# Patient Record
Sex: Female | Born: 1980 | Race: White | Hispanic: No | Marital: Married | Smoking: Never smoker
Health system: Southern US, Community
[De-identification: ages and names within clinical notes are randomized; demographics above are authoritative.]

## PROBLEM LIST (undated history)

## (undated) DIAGNOSIS — F329 Major depressive disorder, single episode, unspecified: Secondary | ICD-10-CM

## (undated) DIAGNOSIS — Z9889 Other specified postprocedural states: Secondary | ICD-10-CM

## (undated) DIAGNOSIS — N390 Urinary tract infection, site not specified: Secondary | ICD-10-CM

## (undated) DIAGNOSIS — N200 Calculus of kidney: Secondary | ICD-10-CM

## (undated) DIAGNOSIS — Z87442 Personal history of urinary calculi: Secondary | ICD-10-CM

## (undated) DIAGNOSIS — I10 Essential (primary) hypertension: Secondary | ICD-10-CM

## (undated) DIAGNOSIS — R112 Nausea with vomiting, unspecified: Secondary | ICD-10-CM

## (undated) DIAGNOSIS — F909 Attention-deficit hyperactivity disorder, unspecified type: Secondary | ICD-10-CM

## (undated) DIAGNOSIS — U071 COVID-19: Secondary | ICD-10-CM

## (undated) DIAGNOSIS — F419 Anxiety disorder, unspecified: Secondary | ICD-10-CM

## (undated) DIAGNOSIS — F32A Depression, unspecified: Secondary | ICD-10-CM

## (undated) DIAGNOSIS — C8190 Hodgkin lymphoma, unspecified, unspecified site: Secondary | ICD-10-CM

## (undated) DIAGNOSIS — E079 Disorder of thyroid, unspecified: Secondary | ICD-10-CM

## (undated) DIAGNOSIS — C801 Malignant (primary) neoplasm, unspecified: Secondary | ICD-10-CM

## (undated) DIAGNOSIS — J45909 Unspecified asthma, uncomplicated: Secondary | ICD-10-CM

## (undated) DIAGNOSIS — Z973 Presence of spectacles and contact lenses: Secondary | ICD-10-CM

## (undated) HISTORY — DX: Essential (primary) hypertension: I10

## (undated) HISTORY — PX: KIDNEY SURGERY: SHX687

## (undated) HISTORY — DX: Urinary tract infection, site not specified: N39.0

## (undated) HISTORY — DX: Depression, unspecified: F32.A

## (undated) HISTORY — DX: Major depressive disorder, single episode, unspecified: F32.9

## (undated) HISTORY — DX: Calculus of kidney: N20.0

## (undated) HISTORY — PX: TONSILLECTOMY: SUR1361

## (undated) HISTORY — DX: Hodgkin lymphoma, unspecified, unspecified site: C81.90

## (undated) HISTORY — PX: PORT-A-CATH REMOVAL: SHX5289

## (undated) HISTORY — DX: COVID-19: U07.1

## (undated) HISTORY — DX: Anxiety disorder, unspecified: F41.9

---

## 2003-11-28 HISTORY — PX: OTHER SURGICAL HISTORY: SHX169

## 2016-12-04 ENCOUNTER — Encounter: Payer: Self-pay | Admitting: *Deleted

## 2016-12-04 ENCOUNTER — Emergency Department: Payer: BLUE CROSS/BLUE SHIELD

## 2016-12-04 ENCOUNTER — Emergency Department
Admission: EM | Admit: 2016-12-04 | Discharge: 2016-12-04 | Disposition: A | Discharge status: Home / Self Care | Payer: BLUE CROSS/BLUE SHIELD | Attending: Emergency Medicine | Admitting: Emergency Medicine

## 2016-12-04 DIAGNOSIS — W228XXA Striking against or struck by other objects, initial encounter: Secondary | ICD-10-CM | POA: Insufficient documentation

## 2016-12-04 DIAGNOSIS — J111 Influenza due to unidentified influenza virus with other respiratory manifestations: Secondary | ICD-10-CM

## 2016-12-04 DIAGNOSIS — E860 Dehydration: Secondary | ICD-10-CM | POA: Diagnosis not present

## 2016-12-04 DIAGNOSIS — Y9301 Activity, walking, marching and hiking: Secondary | ICD-10-CM | POA: Insufficient documentation

## 2016-12-04 DIAGNOSIS — S0990XA Unspecified injury of head, initial encounter: Secondary | ICD-10-CM

## 2016-12-04 DIAGNOSIS — R55 Syncope and collapse: Secondary | ICD-10-CM | POA: Diagnosis not present

## 2016-12-04 DIAGNOSIS — Y92000 Kitchen of unspecified non-institutional (private) residence as  the place of occurrence of the external cause: Secondary | ICD-10-CM | POA: Insufficient documentation

## 2016-12-04 DIAGNOSIS — S0181XA Laceration without foreign body of other part of head, initial encounter: Secondary | ICD-10-CM

## 2016-12-04 DIAGNOSIS — Y999 Unspecified external cause status: Secondary | ICD-10-CM | POA: Diagnosis not present

## 2016-12-04 DIAGNOSIS — Z79899 Other long term (current) drug therapy: Secondary | ICD-10-CM | POA: Diagnosis not present

## 2016-12-04 DIAGNOSIS — S01112A Laceration without foreign body of left eyelid and periocular area, initial encounter: Secondary | ICD-10-CM | POA: Diagnosis not present

## 2016-12-04 HISTORY — DX: Calculus of kidney: N20.0

## 2016-12-04 HISTORY — DX: Disorder of thyroid, unspecified: E07.9

## 2016-12-04 HISTORY — DX: Malignant (primary) neoplasm, unspecified: C80.1

## 2016-12-04 LAB — BASIC METABOLIC PANEL
ANION GAP: 10 (ref 5–15)
BUN: 12 mg/dL (ref 6–20)
CHLORIDE: 99 mmol/L — AB (ref 101–111)
CO2: 27 mmol/L (ref 22–32)
Calcium: 9.3 mg/dL (ref 8.9–10.3)
Creatinine, Ser: 0.77 mg/dL (ref 0.44–1.00)
GFR calc non Af Amer: >60 mL/min (ref >60)
Glucose, Bld: 98 mg/dL (ref 65–99)
Potassium: 3.1 mmol/L — ABNORMAL LOW (ref 3.5–5.1)
Sodium: 136 mmol/L (ref 135–145)

## 2016-12-04 LAB — URINALYSIS, COMPLETE (UACMP) WITH MICROSCOPIC
BILIRUBIN URINE: NEGATIVE
Bacteria, UA: NONE SEEN
Glucose, UA: NEGATIVE mg/dL
Hgb urine dipstick: NEGATIVE
KETONES UR: 5 mg/dL — AB
Leukocytes, UA: NEGATIVE
Nitrite: NEGATIVE
PH: 7 (ref 5.0–8.0)
Protein, ur: NEGATIVE mg/dL
Specific Gravity, Urine: 1.014 (ref 1.005–1.030)

## 2016-12-04 LAB — CBC
HEMATOCRIT: 40.8 % (ref 35.0–47.0)
HEMOGLOBIN: 14.4 g/dL (ref 12.0–16.0)
MCH: 30.1 pg (ref 26.0–34.0)
MCHC: 35.2 g/dL (ref 32.0–36.0)
MCV: 85.6 fL (ref 80.0–100.0)
Platelets: 293 10*3/uL (ref 150–440)
RBC: 4.77 MIL/uL (ref 3.80–5.20)
RDW: 13.4 % (ref 11.5–14.5)
WBC: 6.7 10*3/uL (ref 3.6–11.0)

## 2016-12-04 LAB — INFLUENZA PANEL BY PCR (TYPE A & B)
INFLAPCR: NEGATIVE
INFLBPCR: POSITIVE — AB

## 2016-12-04 LAB — POCT PREGNANCY, URINE: Preg Test, Ur: NEGATIVE

## 2016-12-04 MED ORDER — SODIUM CHLORIDE 0.9 % IV BOLUS (SEPSIS)
1000.0000 mL | Freq: Once | INTRAVENOUS | Status: AC
  Administered 2016-12-04: 1000 mL via INTRAVENOUS

## 2016-12-04 MED ORDER — LEVOFLOXACIN 750 MG PO TABS: 750.0000 mg | ORAL_TABLET | Freq: Every day | ORAL | 0 refills | Status: AC

## 2016-12-04 MED ORDER — ACETAMINOPHEN 325 MG PO TABS
650.0000 mg | ORAL_TABLET | Freq: Once | ORAL | Status: AC
  Administered 2016-12-04: 650 mg via ORAL
  Filled 2016-12-04: qty 2

## 2016-12-04 MED ORDER — OSELTAMIVIR PHOSPHATE 75 MG PO CAPS: 75.0000 mg | ORAL_CAPSULE | Freq: Two times a day (BID) | ORAL | 0 refills | Status: AC

## 2016-12-04 NOTE — Discharge Instructions (Signed)
Return to the emergency room for any new or worrisome symptoms. Patient is sleeping 8 hours a night if possible and drink plenty of fluids. We do advise following up with therapist as an outpatient as discussed for anxiety, as well as ENT, for your sinusitis. At this time, it is not clear whether or not there is an active sinus infection as you have the flu and recently had antibiotics. ENT feels that you  may benefit from Levaquin for your sinuses. We have given your prescription. Be aware of that Levaquin can cause tendon issues and he must be careful taking it and performing any athletic behaviors. Please follow up with ENT for your sinus. Return to the emergency room if you feel any new or worrisome symptoms including lightheadedness, vomiting, dehydration, lethargy, fever, lightheadedness, or any other concerns. It was a pleasure to take care of you would wish to the best

## 2016-12-04 NOTE — ED Notes (Signed)
Pt alert and oriented X4, active, cooperative, pt in NAD. RR even and unlabored, color WNL.  Pt informed to return if any life threatening symptoms occur.   

## 2016-12-04 NOTE — ED Provider Notes (Addendum)
[facility] Emergency Department Provider Note  ____________________________________________   I have reviewed the triage vital signs and the nursing notes.   HISTORY  Chief Complaint Loss of Consciousness    HPI Samantha Meyers is a 36 y.o. female , largely healthy does have a history of thyroid disease and hypertension, presents today after having a syncopal event at home. Patient states that her entire family has the flu, everyone is sick. She began have flulike symptoms yesterday, with fevers rhinorrhea and slight cough. She has not had much to eat or drink because she has been off caring nonstop for her family. She has had no abdominal pain vomiting or diarrhea. She states this morning however she got up felt a little bit lightheaded when she was walking downstairs to get food for her children and then passed out. She bumped her head. She does not document any chest pain shortness of breath or vomiting. At this time, she has pain to her face where she hit her face on the right as well as on the left forehead and a little bit of neck discomfort otherwise she states she feels just a little bit lightheaded from not eating or drinking for the last few days. He is under great deal stress because her father is also scheduled to start chemotherapy for lung cancer, and her son was recently diagnosed as a type I diabetic. She herself is not diabetic. She denies alcohol abuse ordered recreational drug abuse. Patient does state that she has a history of sinus disease and just came off antibiotics a few days ago for her sinusitis which she believes is getting better prior to this new cough, rhinorrhea, flu symptoms and she Is up-to-date she states On her tetanus  Past Medical History:  Diagnosis Date  . Cancer ([place])   . Kidney stones   . Thyroid disease     There are no active problems to display for this patient.   History reviewed. No pertinent surgical  history.  Prior to Admission medications   Medication Sig Start Date End Date Taking? Authorizing Provider  amphetamine-dextroamphetamine (ADDERALL XR) 20 MG 24 hr capsule Take 20 mg by mouth daily. 11/23/16  Yes Historical Provider, MD  chlorthalidone (HYGROTON) 25 MG tablet Take 25 mg by mouth daily. 10/30/16  Yes Historical Provider, MD  levothyroxine (SYNTHROID, LEVOTHROID) 100 MCG tablet Take 100 mcg by mouth daily. 11/23/16  Yes Historical Provider, MD  sertraline (ZOLOFT) 50 MG tablet Take 50 mg by mouth daily. 11/23/16  Yes Historical Provider, MD    Allergies Patient has no known allergies.  No family history on file.  Social History Social History  Substance Use Topics  . Smoking status: Never Smoker  . Smokeless tobacco: Never Used  . Alcohol use No    Review of Systems Constitutional: Positive fever/chills Eyes: No visual changes. ENT: No sore throat. No stiff neck no neck pain Cardiovascular: Denies chest pain. Respiratory: Denies shortness of breath. Onset of cough Gastrointestinal:   no vomiting.  No diarrhea.  No constipation. Genitourinary: Negative for dysuria. Musculoskeletal: Negative lower extremity swelling Skin: Negative for rash. Neurological: Negative for severe headaches, focal weakness or numbness. 10-point ROS otherwise negative.  ____________________________________________   PHYSICAL EXAM:  VITAL SIGNS: ED Triage Vitals  Enc Vitals Group     BP 12/04/16 0928 118/90     Pulse Rate 12/04/16 0928 (!) 101     Resp 12/04/16 0928 18     Temp 12/04/16 0928 99.1 F (37.3  C)     Temp Source 12/04/16 0928 Oral     SpO2 12/04/16 0928 97 %     Weight 12/04/16 0929 157 lb (71.2 kg)     Height 12/04/16 0929 5\' 4"  (1.626 m)     Head Circumference --      Peak Flow --      Pain Score 12/04/16 1052 2     Pain Loc --      Pain Edu? --      Excl. in [place]? --     Constitutional: Alert and oriented. Well appearing and in no acute distress. Eyes:  Conjunctivae are normal. PERRL. EOMI. Head: Superficial laceration above the left eyebrow, linear, no significant bleeding at this time. Also noted is some bruising to the right zygomatic region, and her front incisor is tender to touch but not loose at this time.. Nose: As it is mild congestion/rhinnorhea. Mouth/Throat: Mucous membranes are dry.  Oropharynx non-erythematous. Neck: No stridor.   Minimal tenderness to palpation mostly in the paraspinal region with no meningismus Cardiovascular: Normal rate, regular rhythm. Grossly normal heart sounds.  Good peripheral circulation. Respiratory: Normal respiratory effort.  No retractions. Lungs CTAB. Abdominal: Soft and nontender. No distention. No guarding no rebound Back:  There is no focal tenderness or step off.  there is no midline tenderness there are no lesions noted. there is no CVA tenderness Musculoskeletal: No lower extremity tenderness, no upper extremity tenderness. No joint effusions, no DVT signs strong distal pulses no edema Neurologic:  Normal speech and language. No gross focal neurologic deficits are appreciated.  Skin:  Skin is warm, dry and intact. No rash noted. Psychiatric: Mood and affect are normal. Speech and behavior are normal.  ____________________________________________   LABS (all labs ordered are listed, but only abnormal results are displayed)  Labs Reviewed  BASIC METABOLIC PANEL - Abnormal; Notable for the following:       Result Value   Potassium 3.1 (*)    Chloride 99 (*)    All other components within normal limits  CBC  URINALYSIS, COMPLETE (UACMP) WITH MICROSCOPIC  INFLUENZA PANEL BY PCR (TYPE A & B)  CBG MONITORING, ED  POC URINE PREG, ED   ____________________________________________  EKG  I personally interpreted any EKGs ordered by me or triage Sinus tachycardia rate 104 no acute ST elevation or depression normal axis unremarkable EKG aside from mild tachycardia, nonspecific ST raise  noted ____________________________________________  RADIOLOGY  I reviewed any imaging ordered by me or triage that were performed during my shift and, if possible, patient and/or family made aware of any abnormal findings. ____________________________________________   PROCEDURES  Procedure(s) performed: None  Procedures  Critical Care performed: None  ____________________________________________   INITIAL IMPRESSION / ASSESSMENT AND PLAN / ED COURSE  Pertinent labs & imaging results that were available during my care of the patient were reviewed by me and considered in my medical decision making (see chart for details).  Patient with a syncopal event after not eating or drinking for the last few days he cut she is taking care of possible different family members who are ill with the flu. Her son has influenza B. Her son also has type 1 diabetes and she states she is just not been taking care of herself. She denies a chest pain or shortness of breath. No history of early cardiac death or PE in herself or her family. Patient's EKG is reassuring. Vital signs are reassuring. Given that she has muscle areas of  possible injury to her head and she has some neck pain we will obtain CT scan. Given that she has a cough all get a chest x-ray. We are giving her IV fluids which is a think is the most important intervention and we will reassess.  ----------------------------------------- 1:42 PM on 12/04/2016 -----------------------------------------  Patient is positive for the flu, she does have sinus disease which is chronic in appearance, there is no evidence of abscess. I did discuss with ENT and they recommended outpatient follow-up. Her flu test is I think the cause of her symptoms. She has no complaint at this time. I did place some Dermabond over that superficial cut however I don't think it requires stitches. I did explain to the patient will be a scar either way. I did explain to her  that I am willing to try to sew it up if she would like me to but I don't think it'll make much of a difference in terms of cosmesis or healing. Patient would prefer not to have sutures at this time which I don't think is unreasonable. The patient is in no acute distress and feels much better. We will check orthostatic blood pressure on her. I did discuss admission the patient is adamant that she needs to go home and take care of her family which again is not unreasonable. She is not orthostatic is my hope we can get her safely home I think this is all a result of the flu and poor intake.  ----------------------------------------- 2:06 PM on 12/04/2016 -----------------------------------------  Chest further with patient and ENT, Dr. Pryor Ochoa , he feels that the patient may benefit from ongoing antibiotics for this although he is not sure as she is acutely sick right now with the flu. We discussed with the patient whether she would like to take Levaquin we did discuss with her the risks of taking Levaquin. This time she prefer not to do it as she just got off 10 days of antibiotics and felt that her sinus problems were getting better. This is been a "lifelong" problem of sinusitis. She has a normal white count, and I feel that her symptoms are all because she became acutely ill with the flu. Nonetheless I will give her prescription for the Levaquin as suggested by ENT. In addition, return precautions and follow up within given. Patient is able to stand up with no difficulty we'll give her another liter of fluid here to ensure that she is adequately hydrated she is eager to go home and does not wish to be admitted. Finally, patient has a history of anxiety and she is feeling very stressed right now. She has no SI or HI but she is requesting outpatient psychiatric referral. I have asked TTS to come see her, and they will help advise her for outpatient referral. Patient very comfortable with all this plan and is  requesting discharge.   ____________________________________________   FINAL CLINICAL IMPRESSION(S) / ED DIAGNOSES  Final diagnoses:  None      This chart was dictated using voice recognition software.  Despite best efforts to proofread,  errors can occur which can change meaning.      Schuyler Amor, MD 12/04/16 1245    Schuyler Amor, MD 12/04/16 1252    Schuyler Amor, MD 12/04/16 [place], MD 12/04/16 [id number]

## 2016-12-04 NOTE — ED Notes (Signed)
Pt given wheelchair upon arrival. Laceration to left eyebrow. PT in NAD, RR even and unlabored, color WNL.

## 2016-12-04 NOTE — ED Notes (Signed)
Pt assisted to the bathroom with this RN at bedside by her SO. PT tolerated well. Urine sample obtained, flu swab obtained, IV started and 1L NS started per MD order. VSS and WNL at this time, pt remains alert and oriented. Will continue to monitor for further patient needs.

## 2016-12-04 NOTE — BH Assessment (Signed)
Per the requested ER MD (Dr. Burlene Arnt), writer spoke with the patient and provided her with information for outpatient treatment.  Writer also advised the patient to call the toll free phone on his insurance card for the counselors and agencies in her network.  Patient advised to call the [facility] Program through their job to help with Outpatient Treatment.  Patient denies SI/HI and AV/H.   Referrals; Tommie Raymond, [place]  [place], [place] [phone]   [facility] [address]  [place], [place] [phone]  [facility] [place], [place] [phone] [place] [address]  [place], [place] [phone]  [facility] [address]  [address] [place], [place] [phone]

## 2016-12-04 NOTE — ED Notes (Signed)
Pt taken to CT at this time. This RN introduced self to patient/SO. This RN apologized for delay at this time. Pt states understanding. This RN explained plan of care to patient, pt states understanding at this time.

## 2016-12-04 NOTE — ED Notes (Signed)
Report given to Ally, RN 

## 2016-12-04 NOTE — ED Triage Notes (Addendum)
States she did not feel well last night, states fever and congestion, states this AM she woke up and had a syncopal episode in the kitchen, states she hit her head, arrives with laceration to left eye brow and redness to right eye, denies any use of blood thinners, family in house has had flu B, at present pt awake and alert, states she just finished abx for a sinus infection, states she has been under a lot of family stress

## 2016-12-04 NOTE — ED Notes (Signed)
[place], TTS at bedside at this time.

## 2017-01-07 ENCOUNTER — Telehealth: Payer: Self-pay | Admitting: Medical

## 2017-01-20 ENCOUNTER — Encounter: Payer: Self-pay | Admitting: Medical

## 2017-01-20 ENCOUNTER — Ambulatory Visit: Payer: Self-pay | Admitting: Medical

## 2017-01-20 VITALS — BP 118/82 | HR 78 | Temp 97.8°F | Resp 16 | Ht 64.0 in | Wt 162.0 lb

## 2017-01-20 DIAGNOSIS — F419 Anxiety disorder, unspecified: Secondary | ICD-10-CM

## 2017-01-20 DIAGNOSIS — F900 Attention-deficit hyperactivity disorder, predominantly inattentive type: Secondary | ICD-10-CM

## 2017-01-20 DIAGNOSIS — E039 Hypothyroidism, unspecified: Secondary | ICD-10-CM

## 2017-01-20 DIAGNOSIS — F329 Major depressive disorder, single episode, unspecified: Secondary | ICD-10-CM

## 2017-01-20 DIAGNOSIS — F32A Depression, unspecified: Secondary | ICD-10-CM

## 2017-01-20 DIAGNOSIS — Z760 Encounter for issue of repeat prescription: Secondary | ICD-10-CM

## 2017-01-20 MED ORDER — AMPHETAMINE-DEXTROAMPHET ER 20 MG PO CP24: 20.0000 mg | ORAL_CAPSULE | ORAL | 0 refills | Status: DC

## 2017-01-20 NOTE — Progress Notes (Signed)
   Subjective:    Patient ID: Samantha Meyers, female    DOB: 26-Dec-1980, 36 y.o.   MRN: [id number]  HPI 36 yo female returns to clinic for Adderall XR 20 mg refill.  Out of Adderall x 1.5 weeks, usually does not take it during spring break. She tells me today that her anxiety and depression are much better then during her last visit (11/18/16).  She feels that she is not hopeless and feels that she has some information and is working on getting her son's diabetes under control. She does have to get up every night at 2 am to check her sons blood sugar. This has caused some anxiety about sleeping , but once she has checked him , she says she has no problem falling asleep.  She is just worried about what his blood sugar numbers will be. She also shares with me today that her father was diagnosed with lung cancer  2 weeks after her son was diagnosed with diabetes. She says she just returned from a trip to [place] visiting her father and feels he is getting the proper treatment.   Review of Systems  Constitutional: Negative for fever and unexpected weight change.  Eyes: Positive for itching.  Respiratory: Negative for chest tightness and shortness of breath.   Cardiovascular: Negative for chest pain.  Gastrointestinal: Negative for diarrhea, nausea and vomiting.    Resolved sinusitis ( from last visit) , Flu 3 weeks ago. Feels well now. Denies any tachycardia, weight loss.or insomnia. Taking new prescription of Levothyroxine 100 mcg will check.      Objective:   Physical Exam  Constitutional: She is oriented to person, place, and time. She appears well-developed and well-nourished.  HENT:  Head: Normocephalic and atraumatic.  Eyes: EOM are normal. Pupils are equal, round, and reactive to light.  Neck: Normal range of motion.  Cardiovascular: Normal rate, regular rhythm and normal heart sounds.  Exam reveals no gallop and no friction rub.   No murmur heard. Pulmonary/Chest: Effort normal.  She has no wheezes. She has no rales.  Musculoskeletal: Normal range of motion.  Neurological: She is alert and oriented to person, place, and time.  Skin: Skin is warm and dry.  Psychiatric: She has a normal mood and affect. Her behavior is normal. Judgment and thought content normal.  Nursing note and vitals reviewed.   GAD-7=4  Today, last vist 12 (11/18/16) PHQ-9=6 Today, last visit was 14(11/18/16) not in epic we were not on the epic system yet.      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 Anxiety and Depression situaltional/stressors , improved from last visit, on Zoloft 50 mg last prescription was for 90 days, pt states she has enough medication.  Reviewed with patient that if she has any concerns or questions to not hesitate to follow up with me. Encouraged patient to seek out counseling and reviewed [place] [employer].She seemed open to counseling. Hypothyroidism will recheck TSH and thyroid panel and adjust medication if needed.

## 2017-02-26 ENCOUNTER — Other Ambulatory Visit: Payer: Self-pay

## 2017-02-26 DIAGNOSIS — E039 Hypothyroidism, unspecified: Secondary | ICD-10-CM

## 2017-02-27 LAB — THYROID PANEL WITH TSH
FREE THYROXINE INDEX: 2.7 (ref 1.2–4.9)
T3 UPTAKE RATIO: 26 % (ref 24–39)
T4 TOTAL: 10.4 ug/dL (ref 4.5–12.0)
TSH: 1.37 u[IU]/mL (ref 0.450–4.500)

## 2017-03-04 ENCOUNTER — Other Ambulatory Visit: Payer: Self-pay | Admitting: Medical

## 2017-03-04 DIAGNOSIS — E039 Hypothyroidism, unspecified: Secondary | ICD-10-CM

## 2017-03-04 MED ORDER — LEVOTHYROXINE SODIUM 100 MCG PO TABS: 100.0000 ug | ORAL_TABLET | Freq: Every day | ORAL | 0 refills | Status: DC

## 2017-03-16 DIAGNOSIS — Z3042 Encounter for surveillance of injectable contraceptive: Secondary | ICD-10-CM | POA: Diagnosis not present

## 2017-03-18 ENCOUNTER — Other Ambulatory Visit: Payer: Self-pay | Admitting: Medical

## 2017-03-18 DIAGNOSIS — F419 Anxiety disorder, unspecified: Secondary | ICD-10-CM

## 2017-03-18 MED ORDER — SERTRALINE HCL 50 MG PO TABS: 50.0000 mg | ORAL_TABLET | Freq: Every day | ORAL | 0 refills | Status: DC

## 2017-05-06 ENCOUNTER — Ambulatory Visit: Payer: Self-pay | Admitting: Medical

## 2017-05-06 VITALS — BP 102/82 | HR 77 | Temp 98.8°F | Wt 163.0 lb

## 2017-05-06 DIAGNOSIS — I10 Essential (primary) hypertension: Secondary | ICD-10-CM | POA: Insufficient documentation

## 2017-05-06 DIAGNOSIS — Z760 Encounter for issue of repeat prescription: Secondary | ICD-10-CM

## 2017-05-06 DIAGNOSIS — F900 Attention-deficit hyperactivity disorder, predominantly inattentive type: Secondary | ICD-10-CM

## 2017-05-06 MED ORDER — AMPHETAMINE-DEXTROAMPHET ER 20 MG PO CP24: 20.0000 mg | ORAL_CAPSULE | ORAL | 0 refills | Status: DC

## 2017-05-06 MED ORDER — CHLORTHALIDONE 25 MG PO TABS: 25.0000 mg | ORAL_TABLET | Freq: Every day | ORAL | 0 refills | Status: DC

## 2017-05-06 NOTE — Progress Notes (Signed)
   Subjective:    Patient ID: Samantha Meyers, female    DOB: November 16, 1980, 36 y.o.   MRN: [id number]  HPI 36 yo female here for refill of Adderall. During vacation time she had stopped taking medication daily. She found she feels more anxious when not taking medication.  She denies any tachycardia, dizziness, chest pain.  Review of Systems  Constitutional: Negative.   Respiratory: Negative.   Cardiovascular: Negative.   Neurological: Negative.        Objective:   Physical Exam  Constitutional: She appears well-developed and well-nourished.  HENT:  Head: Normocephalic and atraumatic.  Eyes: Pupils are equal, round, and reactive to light. Conjunctivae and EOM are normal.  Cardiovascular: Normal rate, regular rhythm and normal heart sounds.  Exam reveals no gallop and no friction rub.   No murmur heard. Pulmonary/Chest: Effort normal and breath sounds normal.  Nursing note and vitals reviewed.         Assessment & Plan:  ADHD and Hypertension Printed Adderal XR 15m one by mouth every morning #90 no refills. E-prescribed Chlorthalidone 25 mg by mouth daily #90 no refills. [place] labs in April in [place] Needs recheck of TSH and Met C. Patient can schedule appointment to have these completed. Return to the clinic as needed.

## 2017-05-21 ENCOUNTER — Encounter: Payer: Self-pay | Admitting: *Deleted

## 2017-05-21 DIAGNOSIS — F32A Depression, unspecified: Secondary | ICD-10-CM | POA: Insufficient documentation

## 2017-05-21 DIAGNOSIS — F329 Major depressive disorder, single episode, unspecified: Secondary | ICD-10-CM | POA: Insufficient documentation

## 2017-05-21 DIAGNOSIS — F39 Unspecified mood [affective] disorder: Secondary | ICD-10-CM | POA: Insufficient documentation

## 2017-05-21 DIAGNOSIS — F419 Anxiety disorder, unspecified: Secondary | ICD-10-CM

## 2017-05-21 DIAGNOSIS — E039 Hypothyroidism, unspecified: Secondary | ICD-10-CM | POA: Insufficient documentation

## 2017-05-21 DIAGNOSIS — J329 Chronic sinusitis, unspecified: Secondary | ICD-10-CM | POA: Insufficient documentation

## 2017-06-08 DIAGNOSIS — Z923 Personal history of irradiation: Secondary | ICD-10-CM | POA: Insufficient documentation

## 2017-06-08 DIAGNOSIS — Z01419 Encounter for gynecological examination (general) (routine) without abnormal findings: Secondary | ICD-10-CM | POA: Diagnosis not present

## 2017-06-16 ENCOUNTER — Other Ambulatory Visit: Payer: Self-pay | Admitting: Medical

## 2017-06-16 DIAGNOSIS — E039 Hypothyroidism, unspecified: Secondary | ICD-10-CM

## 2017-06-16 NOTE — Telephone Encounter (Signed)
Patient needs recheck of TSH panel.

## 2017-06-26 ENCOUNTER — Other Ambulatory Visit: Payer: Self-pay | Admitting: Medical

## 2017-06-26 DIAGNOSIS — F419 Anxiety disorder, unspecified: Secondary | ICD-10-CM

## 2017-06-29 NOTE — Telephone Encounter (Signed)
Patient needs primary care physical and labs.

## 2017-07-15 ENCOUNTER — Telehealth: Payer: Self-pay

## 2017-07-15 NOTE — Telephone Encounter (Signed)
Pt has been seen here and gotten refills for medications, she needs a primary care lab visit and to be scheduled with her Preferred Primary care provider.

## 2017-07-16 ENCOUNTER — Other Ambulatory Visit: Payer: Self-pay | Admitting: Medical

## 2017-07-16 DIAGNOSIS — Z0000 Encounter for general adult medical examination without abnormal findings: Secondary | ICD-10-CM

## 2017-07-16 DIAGNOSIS — E039 Hypothyroidism, unspecified: Secondary | ICD-10-CM

## 2017-07-17 ENCOUNTER — Other Ambulatory Visit: Payer: Self-pay | Admitting: Adult Health

## 2017-07-17 DIAGNOSIS — E039 Hypothyroidism, unspecified: Secondary | ICD-10-CM

## 2017-07-17 MED ORDER — LEVOTHYROXINE SODIUM 100 MCG PO TABS: 100.0000 ug | ORAL_TABLET | Freq: Every day | ORAL | 0 refills | Status: DC

## 2017-07-17 NOTE — Progress Notes (Signed)
Meds ordered this encounter  Medications  . levothyroxine (SYNTHROID, LEVOTHROID) 100 MCG tablet    Sig: Take 1 tablet (100 mcg total) by mouth daily.    Dispense:  15 tablet    Refill:  0   E prescribed as above/ discussed with [place], Estill Dooms, PA-C who will call or have secretary call patient to schedule labs and appointment with Talmage Nap, PA-C Before any further additional refills.

## 2017-07-20 NOTE — Telephone Encounter (Signed)
Orders place for patient to get in the future Exec female and vit D level. Belinda calling patient to schedule labs. She will also need future primary care appointment.

## 2017-07-28 ENCOUNTER — Other Ambulatory Visit: Payer: Self-pay

## 2017-07-28 DIAGNOSIS — E039 Hypothyroidism, unspecified: Secondary | ICD-10-CM

## 2017-07-28 DIAGNOSIS — Z0000 Encounter for general adult medical examination without abnormal findings: Secondary | ICD-10-CM

## 2017-07-29 LAB — CMP12+LP+TP+TSH+6AC+CBC/D/PLT
ALBUMIN: 4.5 g/dL (ref 3.5–5.5)
ALK PHOS: 58 IU/L (ref 39–117)
ALT: 18 IU/L (ref 0–32)
AST: 27 IU/L (ref 0–40)
Albumin/Globulin Ratio: 1.8 (ref 1.2–2.2)
BASOS ABS: 0 10*3/uL (ref 0.0–0.2)
BILIRUBIN TOTAL: 0.5 mg/dL (ref 0.0–1.2)
BUN / CREAT RATIO: 11 (ref 9–23)
BUN: 10 mg/dL (ref 6–20)
Basos: 1 %
CHLORIDE: 100 mmol/L (ref 96–106)
CHOLESTEROL TOTAL: 158 mg/dL (ref 100–199)
Calcium: 9.4 mg/dL (ref 8.7–10.2)
Chol/HDL Ratio: 2.2 ratio (ref 0.0–4.4)
Creatinine, Ser: 0.9 mg/dL (ref 0.57–1.00)
EOS (ABSOLUTE): 0.2 10*3/uL (ref 0.0–0.4)
Eos: 3 %
Estimated CHD Risk: <0.5 times avg. (ref 0.0–1.0)
FREE THYROXINE INDEX: 1.7 (ref 1.2–4.9)
GFR calc Af Amer: 94 mL/min/{1.73_m2} (ref >59)
GFR, EST NON AFRICAN AMERICAN: 82 mL/min/{1.73_m2} (ref >59)
GGT: 17 IU/L (ref 0–60)
GLOBULIN, TOTAL: 2.5 g/dL (ref 1.5–4.5)
Glucose: 92 mg/dL (ref 65–99)
HDL: 72 mg/dL (ref >39)
HEMATOCRIT: 43.7 % (ref 34.0–46.6)
HEMOGLOBIN: 14.3 g/dL (ref 11.1–15.9)
IMMATURE GRANULOCYTES: 0 %
Immature Grans (Abs): 0 10*3/uL (ref 0.0–0.1)
Iron: 91 ug/dL (ref 27–159)
LDH: 253 IU/L — ABNORMAL HIGH (ref 119–226)
LDL CALC: 73 mg/dL (ref 0–99)
LYMPHS ABS: 2 10*3/uL (ref 0.7–3.1)
LYMPHS: 30 %
MCH: 29.2 pg (ref 26.6–33.0)
MCHC: 32.7 g/dL (ref 31.5–35.7)
MCV: 89 fL (ref 79–97)
MONOS ABS: 0.6 10*3/uL (ref 0.1–0.9)
Monocytes: 9 %
NEUTROS PCT: 57 %
Neutrophils Absolute: 3.9 10*3/uL (ref 1.4–7.0)
Phosphorus: 3.6 mg/dL (ref 2.5–4.5)
Platelets: 315 10*3/uL (ref 150–379)
Potassium: 3.8 mmol/L (ref 3.5–5.2)
RBC: 4.89 x10E6/uL (ref 3.77–5.28)
RDW: 13.3 % (ref 12.3–15.4)
SODIUM: 142 mmol/L (ref 134–144)
T3 Uptake Ratio: 25 % (ref 24–39)
T4, Total: 6.9 ug/dL (ref 4.5–12.0)
TRIGLYCERIDES: 65 mg/dL (ref 0–149)
TSH: 3.75 u[IU]/mL (ref 0.450–4.500)
Total Protein: 7 g/dL (ref 6.0–8.5)
Uric Acid: 4.5 mg/dL (ref 2.5–7.1)
VLDL CHOLESTEROL CAL: 13 mg/dL (ref 5–40)
WBC: 6.7 10*3/uL (ref 3.4–10.8)

## 2017-07-29 LAB — VITAMIN D 25 HYDROXY (VIT D DEFICIENCY, FRACTURES): VIT D 25 HYDROXY: 43.6 ng/mL (ref 30.0–100.0)

## 2017-08-04 ENCOUNTER — Telehealth: Payer: Self-pay

## 2017-08-04 DIAGNOSIS — D225 Melanocytic nevi of trunk: Secondary | ICD-10-CM | POA: Diagnosis not present

## 2017-08-04 NOTE — Telephone Encounter (Signed)
Patient given TSH WNL results. To call back in 3-6 months for a repeat lab.

## 2017-08-04 NOTE — Telephone Encounter (Signed)
Reveiwed TSH lab results with patient. Will call back for repeat labs in 3-6 months.

## 2017-08-04 NOTE — Telephone Encounter (Signed)
-----   Message from Talmage Nap, [place] sent at 08/03/2017 12:45 PM EDT ----- Contact on labs TSH wnl follow up in 3-6 months for recheck. ty H

## 2017-08-10 ENCOUNTER — Telehealth: Payer: Self-pay | Admitting: Medical

## 2017-08-10 DIAGNOSIS — E039 Hypothyroidism, unspecified: Secondary | ICD-10-CM

## 2017-08-10 NOTE — Telephone Encounter (Signed)
Reviewed labs,  TSH wnl, reviewed recheck with Dr. Rosanna Randy, next TSH 3-6 months. Future labs placed.

## 2017-08-14 ENCOUNTER — Other Ambulatory Visit: Payer: Self-pay | Admitting: Medical

## 2017-08-14 DIAGNOSIS — I10 Essential (primary) hypertension: Secondary | ICD-10-CM

## 2017-08-19 ENCOUNTER — Ambulatory Visit: Payer: Self-pay | Admitting: Medical

## 2017-08-19 ENCOUNTER — Encounter: Payer: Self-pay | Admitting: Medical

## 2017-08-19 VITALS — BP 100/64 | HR 68 | Temp 97.8°F | Resp 16 | Wt 170.0 lb

## 2017-08-19 DIAGNOSIS — F900 Attention-deficit hyperactivity disorder, predominantly inattentive type: Secondary | ICD-10-CM

## 2017-08-19 DIAGNOSIS — I10 Essential (primary) hypertension: Secondary | ICD-10-CM

## 2017-08-19 DIAGNOSIS — E039 Hypothyroidism, unspecified: Secondary | ICD-10-CM

## 2017-08-19 DIAGNOSIS — Z760 Encounter for issue of repeat prescription: Secondary | ICD-10-CM

## 2017-08-19 MED ORDER — CHLORTHALIDONE 25 MG PO TABS: 25.0000 mg | ORAL_TABLET | Freq: Every day | ORAL | 0 refills | Status: DC

## 2017-08-19 MED ORDER — LEVOTHYROXINE SODIUM 100 MCG PO TABS: 100.0000 ug | ORAL_TABLET | Freq: Every day | ORAL | 0 refills | Status: DC

## 2017-08-19 MED ORDER — AMPHETAMINE-DEXTROAMPHET ER 20 MG PO CP24: 20.0000 mg | ORAL_CAPSULE | ORAL | 0 refills | Status: DC

## 2017-08-19 NOTE — Patient Instructions (Signed)
Basics of Medicine Management What should I do when I am taking medicines?  Read all of the labels and the inserts that come with your medicines. Review the information often.  Talk with your pharmacist if you notice a change in the size, color, or shape of your medicines.  Try to get all of your medicines at one pharmacy. The pharmacist will have all your information and will understand possible drug interactions.  Ask your health care provider any questions that you have about your prescribed medicines and any over-the-counter medicines, vitamins, and herbal or dietary supplements that you take. It is important to make sure that nothing will interact with any of your prescribed medicines. What should I know about my medicines?  Know the potential side effects for each medicine that you take.  Know what each of your medicines looks like. This includes size, color, and shape. ? If you are getting confused and having trouble recognizing your different medicines, ask your health care provider or pharmacist about changing your medicines or helping you to identify them more easily. How can I take my medicines safely?  Take medicines only as directed by your health care provider. ? Do not take more of your medicine than instructed. ? Do not take anyone else's medicines. ? Do not share your medicines with other people. ? Do not stop taking your medicines unless you have talked about that with your health care provider. ? You may need to avoid alcohol or certain foods or liquids with one or more of your medicines. Follow your health care provider's instructions.  Do not split, mash, or chew your medicines unless your health care provider tells you to do so. Tell your health care provider if you have trouble swallowing your medicines.  For every liquid medicine, use the dosing container that was provided. How should I organized my medicines?  Use a tool, such as a weekly pillbox, a written  chart from your health care provider, a notebook, or your own calendar to organize your medicine schedule.  If you have trouble recognizing your different medicines, keep them in their original bottles.  Create reminders for taking your medicines. Use sticky notes, or use alarms on your watch, mobile device, or phone calendar.  Your organization system should help you to remember the following information about each medicine: ? Name of the medicine. ? Dosage. ? Schedule. This includes the day and time when it should be taken. ? Appearance. This includes color, shape, size, and stamp. ? How to take your medicines. You may need to take them with or without certain foods, on an empty stomach, with fluids, or by following some other instruction.  More advanced medicine management systems are also available. These offer weekly or monthly options that are complete with storage, alarms, and visual and audio prompts.  Review your medicine schedule with a family member, friend, or caregiver. Other household members should understand your medicines.  If you have trouble reading the names of your different medicines, ask your pharmacist to provide your medicines in containers with large print.  If you take any medicines on an "as needed" basis, such as medicines for nausea or pain, it is important that you remember what you have taken and when you did so. Write down the following information each time you take an "as needed" medicine: the name, the dosage, and the date and time that you took it. How should I plan ahead for travel?  Take your pillbox, medicines, and organization   system with you when you travel.  Have your medicines refilled before you leave for travel. This will ensure that you do not run out of your medicines while you are away from home.  Always carry an updated list of your medicines with you. If there is an emergency, a respondent can quickly see what medicines you are taking. How  should I store and discard my medicines?  Store medicines in a cool, dry area away from light or as directed by your pharmacist or health care provider. The bathroom is not a good place for medicine storage because of heat and humidity.  Store your medicines away from chemicals, medicines for your pet, and medicines of other household members.  Keep medicines where children cannot reach them. Do not leave them on counters or bedside tables. Store them in high cabinets or on high shelves.  Check expiration dates regularly. Do not take expired medicines. Discard medicines that are older than the expiration date.  Learn about the best way to dispose of each medicine that you take. Find out if your local government recycling program, hospital, or pharmacy has a medicine take-back program for safe disposal. If not, some medicines may be mixed with inedible substances and thrown away in the trash in a sealed bag or empty container. What should I remember?  Tell your health care provider if you experience side effects, you have new symptoms, or you have other concerns. There may be dosing changes or alternative medicines that would be better for you.  Review your medicines regularly with your health care provider. Ask if you need to continue to take each medicine, and discuss how well each one is working. Medicines, diet, medical conditions, weight changes, and other habits can all affect how medicines work.  Refill your medicines early so that you do not risk running out.  In case of an accidental overdose, call your local Poison Control Center at 1-800-222-1222 or visit your local emergency department immediately. This is important. What should I know about giving medicines to my child?  Use positive reinforcement to help your child take necessary medicines. Try singing, cuddling, and rewards.  Use only the syringes, droppers, dosing spoons, or dosing cups from your child's health care provider or  pharmacist.  Always wash your hands before giving medicines.  Learn about the medicine policies at your child's school. ? Meet with the school nurse to review your child's medicine schedule in detail. ? Do not send oral medicines to school with your child.  If your child has trouble taking medicine, forgets a dose, or spits it up, talk with his or her health care provider.  Do not give over-the-counter cough and cold medicines to your child who is younger than 2 years old, unless directed by his or her health care provider.  Do not give your child aspirin unless instructed to do so by your child's pediatrician or cardiologist.  Make sure that your child knows how to use an inhaler properly, if needed. This information is not intended to replace advice given to you by your health care provider. Make sure you discuss any questions you have with your health care provider. Document Released: 01/28/2011 Document Revised: 06/25/2016 Document Reviewed: 06/15/2014 Elsevier Interactive Patient Education  2018 Elsevier Inc.  

## 2017-08-19 NOTE — Progress Notes (Signed)
   Subjective:    Patient ID: Samantha Meyers, female    DOB: 1981/07/06, 36 y.o.   MRN: [id number]  HPI  36 yo female in non acute distress returns for refill of Adderall XR . No heart racing, dizziness, seizure, eating well, sleeping fine. Needs blood pressure medication refill, needs levothyroxine refill.  As well. Has plenty of Zoloft cuts in 1/2 so only taking  25 mg/ day.  Takes an  occasional claritin. Review of Systems  Constitutional: Positive for activity change. Negative for appetite change.       Objective:   Physical Exam  Constitutional: She is oriented to person, place, and time. She appears well-developed and well-nourished.  HENT:  Head: Normocephalic and atraumatic.  Eyes: Pupils are equal, round, and reactive to light. Conjunctivae and EOM are normal.  Cardiovascular: Normal rate, regular rhythm and normal heart sounds.  Exam reveals no gallop and no friction rub.   No murmur heard. Pulmonary/Chest: Effort normal and breath sounds normal.  Neurological: She is alert and oriented to person, place, and time.  Skin: Skin is warm and dry.  Psychiatric: She has a normal mood and affect. Her behavior is normal. Judgment and thought content normal.          Assessment & Plan:  ADHD / refill medication./ Anxiety and depression \ Hypothyroidism medicatin refill Hypertension medication refill Hand written 3 prescrptions ( total of  90 tablets) of  30 tablets each of  Adderall XR 20mg  take one by mouth every morning #30 no refills. Meds ordered this encounter  Medications  . amphetamine-dextroamphetamine (ADDERALL XR) 20 MG 24 hr capsule    Sig: Take 1 capsule (20 mg total) by mouth every morning.    Dispense:  30 capsule    Refill:  0  . levothyroxine (SYNTHROID, LEVOTHROID) 100 MCG tablet    Sig: Take 1 tablet (100 mcg total) by mouth daily. Take in morning on empty stomach one hour before eating    Dispense:  90 tablet    Refill:  0  . chlorthalidone  (HYGROTON) 25 MG tablet    Sig: Take 1 tablet (25 mg total) by mouth daily.    Dispense:  90 tablet    Refill:  0    After thanksgiving break will schedule primary care exam.  Patient decided she is going to set up primary care with another provider  Patient verbalizes understanding and has no questions at discharge.Marland Kitchen

## 2017-08-27 ENCOUNTER — Telehealth: Payer: Self-pay

## 2017-08-27 NOTE — Telephone Encounter (Signed)
Called pt as requested by H. Ratcliffe, PA to verify if pt still wanted to be a primary care patient here at the clinic; pt states that she will not be a primary care patient here due to her insurance changing and she will chose another provider.

## 2017-11-19 ENCOUNTER — Other Ambulatory Visit: Payer: Self-pay | Admitting: Medical

## 2017-11-19 DIAGNOSIS — E039 Hypothyroidism, unspecified: Secondary | ICD-10-CM

## 2017-11-19 DIAGNOSIS — I10 Essential (primary) hypertension: Secondary | ICD-10-CM

## 2017-11-19 NOTE — Telephone Encounter (Signed)
Patient pharmacy refill request for ADDERRAL and Levothyroxine - she reported to office manager in November she was going elsewhere for primary care. Nursing will call and let her know that she needs to follow up with her pcp for these unless you prefer otherwise.

## 2017-11-20 ENCOUNTER — Telehealth: Payer: Self-pay

## 2017-11-20 NOTE — Telephone Encounter (Signed)
Contacted patient via telephone about refills on her medication and finding a PCP.  We are unable to provide her with any further refills. Information given to patient about the Physician Referral Line and the contact number.  Pt verbalizes understanding of instructions and agrees to make the contact with one of them.

## 2017-11-30 ENCOUNTER — Ambulatory Visit: Payer: Self-pay | Admitting: Medical

## 2017-11-30 VITALS — BP 112/86 | HR 73 | Temp 98.9°F | Wt 174.4 lb

## 2017-11-30 DIAGNOSIS — J029 Acute pharyngitis, unspecified: Secondary | ICD-10-CM

## 2017-11-30 MED ORDER — AMOXICILLIN-POT CLAVULANATE 875-125 MG PO TABS: 1.0000 | ORAL_TABLET | Freq: Two times a day (BID) | ORAL | 0 refills | Status: DC

## 2017-11-30 NOTE — Patient Instructions (Signed)
Pharyngitis  Pharyngitis is a sore throat (pharynx). There is redness, pain, and swelling of your throat.  Follow these instructions at home:  Drink enough fluids to keep your pee (urine) clear or pale yellow.  Only take medicine as told by your doctor.  You may get sick again if you do not take medicine as told. Finish your medicines, even if you start to feel better.  Do not take aspirin.  Rest.  Rinse your mouth (gargle) with salt water (½ tsp of salt per 1 qt of water) every 1-2 hours. This will help the pain.  If you are not at risk for choking, you can suck on hard candy or sore throat lozenges.  Contact a doctor if:  You have large, tender lumps on your neck.  You have a rash.  You cough up green, yellow-brown, or bloody spit.  Get help right away if:  You have a stiff neck.  You drool or cannot swallow liquids.  You throw up (vomit) or are not able to keep medicine or liquids down.  You have very bad pain that does not go away with medicine.  You have problems breathing (not from a stuffy nose).  This information is not intended to replace advice given to you by your health care provider. Make sure you discuss any questions you have with your health care provider.  Document Released: 03/31/2008 Document Revised: 03/20/2016 Document Reviewed: 06/20/2013  Elsevier Interactive Patient Education © 2017 Elsevier Inc.  Otitis Media, Adult  Otitis media is redness, soreness, and puffiness (swelling) in the space just behind your eardrum (middle ear). It may be caused by allergies or infection. It often happens along with a cold.  Follow these instructions at home:  · Take your medicine as told. Finish it even if you start to feel better.  · Only take over-the-counter or prescription medicines for pain, discomfort, or fever as told by your doctor.  · Follow up with your doctor as told.  Contact a doctor if:  · You have otitis media only in one ear, or bleeding from your nose, or both.  · You notice a lump on your  neck.  · You are not getting better in 3-5 days.  · You feel worse instead of better.  Get help right away if:  · You have pain that is not helped with medicine.  · You have puffiness, redness, or pain around your ear.  · You get a stiff neck.  · You cannot move part of your face (paralysis).  · You notice that the bone behind your ear hurts when you touch it.  This information is not intended to replace advice given to you by your health care provider. Make sure you discuss any questions you have with your health care provider.  Document Released: 03/31/2008 Document Revised: 03/20/2016 Document Reviewed: 05/10/2013  Elsevier Interactive Patient Education © 2017 Elsevier Inc.

## 2017-11-30 NOTE — Progress Notes (Signed)
   Subjective:    Patient ID: Samantha Meyers, female    DOB: Jun 22, 1981, 37 y.o.   MRN: [id number]  HPI 37 yo female in non acute disttress with  9 days sore throat that has been  worsening and right ear pain feels like there is a lump on the right side of her thraot.Marland Kitchen  Neighbor child with Scarlett fever. Fever of 101 yesterday , no chills.  Fatigue and body aches. Right side of head witg headache.  Drank water and coffee this morning . Bowel  Of cereal last night , ate nothing this morning.   Review of Systems  Constitutional: Positive for chills.  HENT: Positive for ear pain and sore throat.   Musculoskeletal: Positive for myalgias.  Skin: Negative for rash.   Hx tonsillectomy     Objective:   Physical Exam  Constitutional: She is oriented to person, place, and time. She appears well-developed and well-nourished.  HENT:  Head: Normocephalic and atraumatic.  Right Ear: Hearing, external ear and ear canal normal. Tympanic membrane is erythematous. A middle ear effusion is present.  Left Ear: Hearing, external ear and ear canal normal. A middle ear effusion is present.  Mouth/Throat: Uvula is midline and mucous membranes are normal. Posterior oropharyngeal erythema present. No oropharyngeal exudate or posterior oropharyngeal edema.  Eyes: Conjunctivae and EOM are normal. Pupils are equal, round, and reactive to light.  Neck: Normal range of motion. Neck supple.  Cardiovascular: Normal rate, regular rhythm and normal heart sounds.  Pulmonary/Chest: Effort normal and breath sounds normal.  Lymphadenopathy:    She has no cervical adenopathy.  Neurological: She is alert and oriented to person, place, and time. She has normal reflexes.  Skin: Skin is warm and dry.  Psychiatric: She has a normal mood and affect. Her behavior is normal. Judgment and thought content normal.  Nursing note and vitals reviewed.    Looks like she does not feel well.     Assessment & Plan:  Phayngitis  and right otitis media To take medication with food. Meds ordered this encounter  Medications  . amoxicillin-clavulanate (AUGMENTIN) 875-125 MG tablet    Sig: Take 1 tablet by mouth 2 (two) times daily.    Dispense:  20 tablet    Refill:  0  Return in 3-5 dfays if not improving. Rest , increase fluids , take OTC Motrin or Tylenol as dierected for pain.  Soft foods, increase liquids, given gatorade in clinic.  Patient verbalizes understanding and has no questions at discharge.

## 2017-12-29 ENCOUNTER — Ambulatory Visit: Payer: Self-pay | Admitting: Medical

## 2017-12-29 ENCOUNTER — Other Ambulatory Visit: Payer: Self-pay | Admitting: Medical

## 2017-12-29 VITALS — BP 141/97 | HR 80 | Temp 98.7°F | Resp 16 | Ht 64.0 in | Wt 177.4 lb

## 2017-12-29 DIAGNOSIS — F419 Anxiety disorder, unspecified: Secondary | ICD-10-CM

## 2017-12-29 DIAGNOSIS — I10 Essential (primary) hypertension: Secondary | ICD-10-CM

## 2017-12-29 DIAGNOSIS — F909 Attention-deficit hyperactivity disorder, unspecified type: Secondary | ICD-10-CM

## 2017-12-29 DIAGNOSIS — F32A Depression, unspecified: Secondary | ICD-10-CM

## 2017-12-29 DIAGNOSIS — F329 Major depressive disorder, single episode, unspecified: Secondary | ICD-10-CM

## 2017-12-29 DIAGNOSIS — E039 Hypothyroidism, unspecified: Secondary | ICD-10-CM

## 2017-12-29 MED ORDER — CHLORTHALIDONE 25 MG PO TABS: 25.0000 mg | ORAL_TABLET | Freq: Every day | ORAL | 0 refills | Status: DC

## 2017-12-29 MED ORDER — SERTRALINE HCL 50 MG PO TABS: 50.0000 mg | ORAL_TABLET | Freq: Every day | ORAL | 0 refills | Status: DC

## 2017-12-29 MED ORDER — LEVOTHYROXINE SODIUM 100 MCG PO TABS: 100.0000 ug | ORAL_TABLET | Freq: Every day | ORAL | 0 refills | Status: DC

## 2017-12-29 NOTE — Patient Instructions (Signed)
Hypothyroidism Hypothyroidism is a disorder of the thyroid. The thyroid is a large gland that is located in the lower front of the neck. The thyroid releases hormones that control how the body works. With hypothyroidism, the thyroid does not make enough of these hormones. What are the causes? Causes of hypothyroidism may include:  Viral infections.  Pregnancy.  Your own defense system (immune system) attacking your thyroid.  Certain medicines.  Birth defects.  Past radiation treatments to your head or neck.  Past treatment with radioactive iodine.  Past surgical removal of part or all of your thyroid.  Problems with the gland that is located in the center of your brain (pituitary).  What are the signs or symptoms? Signs and symptoms of hypothyroidism may include:  Feeling as though you have no energy (lethargy).  Inability to tolerate cold.  Weight gain that is not explained by a change in diet or exercise habits.  Dry skin.  Coarse hair.  Menstrual irregularity.  Slowing of thought processes.  Constipation.  Sadness or depression.  How is this diagnosed? Your health care provider may diagnose hypothyroidism with blood tests and ultrasound tests. How is this treated? Hypothyroidism is treated with medicine that replaces the hormones that your body does not make. After you begin treatment, it may take several weeks for symptoms to go away. Follow these instructions at home:  Take medicines only as directed by your health care provider.  If you start taking any new medicines, tell your health care provider.  Keep all follow-up visits as directed by your health care provider. This is important. As your condition improves, your dosage needs may change. You will need to have blood tests regularly so that your health care provider can watch your condition. Contact a health care provider if:  Your symptoms do not get better with treatment.  You are taking thyroid  replacement medicine and: ? You sweat excessively. ? You have tremors. ? You feel anxious. ? You lose weight rapidly. ? You cannot tolerate heat. ? You have emotional swings. ? You have diarrhea. ? You feel weak. Get help right away if:  You develop chest pain.  You develop an irregular heartbeat.  You develop a rapid heartbeat. This information is not intended to replace advice given to you by your health care provider. Make sure you discuss any questions you have with your health care provider. Document Released: 10/13/2005 Document Revised: 03/20/2016 Document Reviewed: 02/28/2014 Elsevier Interactive Patient Education  2018 [employer]. Hypertension During Pregnancy Hypertension is also called high blood pressure. High blood pressure means that the force of your blood moving in your body is too strong. When you are pregnant, this condition should be watched carefully. It can cause problems for you and your baby. Follow these instructions at home: Eating and drinking  Drink enough fluid to keep your pee (urine) clear or pale yellow.  Eat healthy foods that are low in salt (sodium). ? Do not add salt to your food. ? Check labels on foods and drinks to see much salt is in them. Look on the label where you see "Sodium." Lifestyle  Do not use any products that contain nicotine or tobacco, such as cigarettes and e-cigarettes. If you need help quitting, ask your doctor.  Do not use alcohol.  Avoid caffeine.  Avoid stress. Rest and get plenty of sleep. General instructions  Take over-the-counter and prescription medicines only as told by your doctor.  While lying down, lie on your left side.  This keeps pressure off your baby.  While sitting or lying down, raise (elevate) your feet. Try putting some pillows under your lower legs.  Exercise regularly. Ask your doctor what kinds of exercise are best for you.  Keep all prenatal and follow-up visits as told by your doctor.  This is important. Contact a doctor if:  You have symptoms that your doctor told you to watch for, such as: ? Fever. ? Throwing up (vomiting). ? Headache. Get help right away if:  You have very bad pain in your belly (abdomen).  You are throwing up, and this does not get better with treatment.  You suddenly get swelling in your hands, ankles, or face.  You gain 4 lb (1.8 kg) or more in 1 week.  You get bleeding from your vagina.  You have blood in your pee.  You do not feel your baby moving as much as normal.  You have a change in vision.  You have muscle twitching or sudden tightening (spasms).  You have trouble breathing.  Your lips or fingernails turn blue. This information is not intended to replace advice given to you by your health care provider. Make sure you discuss any questions you have with your health care provider. Document Released: 11/15/2010 Document Revised: 06/24/2016 Document Reviewed: 06/24/2016 Elsevier Interactive Patient Education  [employer].

## 2017-12-29 NOTE — Progress Notes (Signed)
   Subjective:    Patient ID: Samantha Meyers, female    DOB: May 04, 1981, 37 y.o.   MRN: [id number]  HPI 37 yo female in non acute distress out of medications . Has appointment but it took  2 .5 months to make  New patient  appointment , New PCP Samantha Meyers March 28th 2019. Says since beign out of her Adderall has had a hard time concentrating notes it during her classes.   Review of Systems  Constitutional: Negative for chills and fever.  HENT: Negative for ear pain and sore throat.   Respiratory: Negative for cough and shortness of breath.   Cardiovascular: Negative for chest pain.  Gastrointestinal: Negative for abdominal pain.  Genitourinary: Negative for dysuria.  Musculoskeletal: Negative for myalgias.  Skin: Negative for rash.  Allergic/Immunologic: Positive for environmental allergies.  Neurological: Positive for headaches (headaches on right side of temple with change in weather). Negative for dizziness and syncope.  Hematological: Negative for adenopathy.  Psychiatric/Behavioral: Positive for decreased concentration (since out Adderall). Negative for behavioral problems, self-injury, sleep disturbance and suicidal ideas. The patient is not nervous/anxious.        Objective:   Physical Exam  Constitutional: She is oriented to person, place, and time. She appears well-developed and well-nourished.  HENT:  Head: Normocephalic and atraumatic.  Eyes: Conjunctivae and EOM are normal. Pupils are equal, round, and reactive to light.  Neck: Normal range of motion.  Cardiovascular: Normal rate, regular rhythm and normal heart sounds.  Pulmonary/Chest: Effort normal and breath sounds normal.  Neurological: She is alert and oriented to person, place, and time.  Skin: Skin is warm and dry.  Psychiatric: She has a normal mood and affect. Her behavior is normal. Judgment and thought content normal.          Assessment & Plan:  Refill of medications till she  can see her new doctor. Hypertension Depression and Anxiety ADHD  Adderal XR 20 mg one po qam #26 no refills hand written prescription. Meds ordered this encounter  Medications  . sertraline (ZOLOFT) 50 MG tablet    Sig: Take 1 tablet (50 mg total) by mouth daily.    Dispense:  30 tablet    Refill:  0  . chlorthalidone (HYGROTON) 25 MG tablet    Sig: Take 1 tablet (25 mg total) by mouth daily.    Dispense:  30 tablet    Refill:  0  . levothyroxine (SYNTHROID, LEVOTHROID) 100 MCG tablet    Sig: Take 1 tablet (100 mcg total) by mouth daily. Take in morning on empty stomach one hour before eating    Dispense:  30 tablet    Refill:  0  Return to the clinic as needed.

## 2017-12-30 NOTE — Telephone Encounter (Signed)
Was this another request ? Saw she saw you on 12/29/17

## 2018-01-21 ENCOUNTER — Encounter: Payer: Self-pay | Admitting: Internal Medicine

## 2018-01-21 ENCOUNTER — Ambulatory Visit: Payer: BLUE CROSS/BLUE SHIELD | Admitting: Internal Medicine

## 2018-01-21 VITALS — BP 132/96 | HR 91 | Temp 98.2°F | Ht 64.0 in | Wt 176.0 lb

## 2018-01-21 DIAGNOSIS — I10 Essential (primary) hypertension: Secondary | ICD-10-CM | POA: Diagnosis not present

## 2018-01-21 DIAGNOSIS — J302 Other seasonal allergic rhinitis: Secondary | ICD-10-CM

## 2018-01-21 DIAGNOSIS — E039 Hypothyroidism, unspecified: Secondary | ICD-10-CM | POA: Diagnosis not present

## 2018-01-21 DIAGNOSIS — M25571 Pain in right ankle and joints of right foot: Secondary | ICD-10-CM | POA: Diagnosis not present

## 2018-01-21 DIAGNOSIS — F329 Major depressive disorder, single episode, unspecified: Secondary | ICD-10-CM

## 2018-01-21 DIAGNOSIS — F900 Attention-deficit hyperactivity disorder, predominantly inattentive type: Secondary | ICD-10-CM | POA: Insufficient documentation

## 2018-01-21 DIAGNOSIS — G4700 Insomnia, unspecified: Secondary | ICD-10-CM | POA: Insufficient documentation

## 2018-01-21 DIAGNOSIS — M25579 Pain in unspecified ankle and joints of unspecified foot: Secondary | ICD-10-CM | POA: Insufficient documentation

## 2018-01-21 DIAGNOSIS — F32A Depression, unspecified: Secondary | ICD-10-CM

## 2018-01-21 DIAGNOSIS — Z8571 Personal history of Hodgkin lymphoma: Secondary | ICD-10-CM | POA: Insufficient documentation

## 2018-01-21 DIAGNOSIS — F419 Anxiety disorder, unspecified: Secondary | ICD-10-CM | POA: Diagnosis not present

## 2018-01-21 HISTORY — DX: Pain in unspecified ankle and joints of unspecified foot: M25.579

## 2018-01-21 MED ORDER — AMPHETAMINE-DEXTROAMPHET ER 20 MG PO CP24: 20.0000 mg | ORAL_CAPSULE | ORAL | 0 refills | Status: DC

## 2018-01-21 NOTE — Progress Notes (Signed)
Pre visit review using our clinic review tool, if applicable. No additional management support is needed unless otherwise documented below in the visit note. 

## 2018-01-21 NOTE — Patient Instructions (Addendum)
F/u in 3 weeks  Try Claritin at night for allergies  Consider hematology/oncology at [facility] and let me know   Ankle Pain Many things can cause ankle pain, including an injury to the area and overuse of the ankle.The ankle joint holds your body weight and allows you to move around. Ankle pain can occur on either side or the back of one ankle or both ankles. Ankle pain may be sharp and burning or dull and aching. There may be tenderness, stiffness, redness, or warmth around the ankle. Follow these instructions at home: Activity  Rest your ankle as told by your health care provider. Avoid any activities that cause ankle pain.  Do exercises as told by your health care provider.  Ask your health care provider if you can drive. Using a brace, a bandage, or crutches  If you were given a brace: ? Wear it as told by your health care provider. ? Remove it when you take a bath or a shower. ? Try not to move your ankle very much, but wiggle your toes from time to time. This helps to prevent swelling.  If you were given an elastic bandage: ? Remove it when you take a bath or a shower. ? Try not to move your ankle very much, but wiggle your toes from time to time. This helps to prevent swelling. ? Adjust the bandage to make it more comfortable if it feels too tight. ? Loosen the bandage if you have numbness or tingling in your foot or if your foot turns cold and blue.  If you have crutches, use them as told by your health care provider. Continue to use them until you can walk without feeling pain in your ankle. Managing pain, stiffness, and swelling  Raise (elevate) your ankle above the level of your heart while you are sitting or lying down.  If directed, apply ice to the area: ? Put ice in a plastic bag. ? Place a towel between your skin and the bag. ? Leave the ice on for 20 minutes, 2-3 times per day. General instructions  Keep all follow-up visits as told by your health care  provider. This is important.  Record this information that may be helpful for you and your health care provider: ? How often you have ankle pain. ? Where the pain is located. ? What the pain feels like.  Take over-the-counter and prescription medicines only as told by your health care provider. Contact a health care provider if:  Your pain gets worse.  Your pain is not relieved with medicines.  You have a fever or chills.  You are having more trouble with walking.  You have new symptoms. Get help right away if:  Your foot, leg, toes, or ankle tingles or becomes numb.  Your foot, leg, toes, or ankle becomes swollen.  Your foot, leg, toes, or ankle turns pale or blue. This information is not intended to replace advice given to you by your health care provider. Make sure you discuss any questions you have with your health care provider. Document Released: 04/02/2010 Document Revised: 06/13/2016 Document Reviewed: 05/15/2015 Elsevier Interactive Patient Education  [employer].

## 2018-01-24 ENCOUNTER — Encounter: Payer: Self-pay | Admitting: Internal Medicine

## 2018-01-24 ENCOUNTER — Other Ambulatory Visit: Payer: Self-pay | Admitting: Medical

## 2018-01-24 DIAGNOSIS — E039 Hypothyroidism, unspecified: Secondary | ICD-10-CM

## 2018-01-24 DIAGNOSIS — I10 Essential (primary) hypertension: Secondary | ICD-10-CM

## 2018-01-24 NOTE — Progress Notes (Signed)
Chief Complaint  Patient presents with  . Establish Care   Establish care  1. C/o right ankle pain she was playing baseball with her son yesterday and stepped the wrong way around 5 pm . Outside ankle is swollen. Pain is 5/10 she tried 2 ibuprofen with some relief.  It hurts with ROM flexion and dorsiflexion with driving makes pain worse and walking/standing.  2. C/o seasonal allergies esp to pollen. Tried Flonase/Claritin. She used to be on allergies shots as a kid.  3. 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 Sleep is interrupted at night  4. H/o hodgkins lymphoma in remission since 2005 last saw H/o 8 years ago and has not had survivor surveillance f/u 5. ADHD needs Rx refills adderrall today was getting from employee health at [facility] 6. HTN BP slightly elevated today on chlorthalidone 25 mg qd   Review of Systems  Constitutional: Negative for weight loss.  HENT: Negative for hearing loss.   Eyes: Negative for blurred vision.  Respiratory: Negative for shortness of breath.   Cardiovascular: Negative for chest pain.  Gastrointestinal: Negative for abdominal pain.  Musculoskeletal: Positive for falls and joint pain.  Skin: Negative for rash.  Neurological: Negative for headaches.  Endo/Heme/Allergies: Positive for environmental allergies.  Psychiatric/Behavioral: Positive for depression. The patient is nervous/anxious and has insomnia.    Past Medical History:  Diagnosis Date  . Anxiety   . Cancer ([place])   . Depression   . Hodgkin's lymphoma ([place])    dx'ed 2005 tx'ed with chemo/radiation  . Hypertension   . Kidney stones   . Thyroid disease   . UTI (urinary tract infection)    Past Surgical History:  Procedure Laterality Date  . KIDNEY  SURGERY     blockage surgery in 2001   . PORT-A-CATH REMOVAL     2005   . TONSILLECTOMY     1987   No family history on file. Social History   Socioeconomic History  . Marital status: Married    Spouse name: Not on file  . Number of children: Not on file  . Years of education: Not on file  . Highest education level: Not on file  Occupational History  . Not on file  Social Needs  . Financial resource strain: Not on file  . Food insecurity:    Worry: Not on file    Inability: Not on file  . Transportation needs:    Medical: Not on file    Non-medical: Not on file  Tobacco Use  . Smoking status: Never Smoker  . Smokeless tobacco: Never Used  Substance and Sexual Activity  . Alcohol use: Yes    Comment: occas.  . Drug use: No  . Sexual activity: Yes  Lifestyle  . Physical activity:    Days per week: Not on file    Minutes per session: Not on file  . Stress: Not on file  Relationships  . Social connections:    Talks on phone: Not on file    Gets together: Not on file    Attends religious service: Not on file    Active member of club or organization: Not on file    Attends meetings of  clubs or organizations: Not on file    Relationship status: Not on file  . Intimate partner violence:    Fear of current or ex partner: Not on file    Emotionally abused: Not on file    Physically abused: Not on file    Forced sexual activity: Not on file  Other Topics Concern  . 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 amphetamine-dextroamphetamine (ADDERALL XR) 20 MG 24 hr capsule Take 1 capsule (20 mg total) by mouth every morning.  . chlorthalidone (HYGROTON) 25 MG tablet Take 1 tablet (25 mg total) by mouth daily.  Marland Kitchen levothyroxine (SYNTHROID, LEVOTHROID) 100 MCG tablet Take 1 tablet (100 mcg total)  by mouth daily. Take in morning on empty stomach one hour before eating  . Multiple Vitamin (MULTIVITAMIN WITH MINERALS) TABS tablet Take 1 tablet by mouth daily.  . sertraline (ZOLOFT) 50 MG tablet Take 1 tablet (50 mg total) by mouth daily.  . [DISCONTINUED] amphetamine-dextroamphetamine (ADDERALL XR) 20 MG 24 hr capsule Take 1 capsule (20 mg total) by mouth every morning.  . [DISCONTINUED] amphetamine-dextroamphetamine (ADDERALL XR) 20 MG 24 hr capsule Take 1 capsule (20 mg total) by mouth every morning.  . [DISCONTINUED] amphetamine-dextroamphetamine (ADDERALL XR) 20 MG 24 hr capsule Take 1 capsule (20 mg total) by mouth every morning.   No Known Allergies No results found for this or any previous visit (from the past 2160 hour(s)). Objective  Body mass index is 30.21 kg/m. Wt Readings from Last 3 Encounters:  01/21/18 176 lb (79.8 kg)  12/29/17 177 lb 6.4 oz (80.5 kg)  11/30/17 174 lb 6.4 oz (79.1 kg)   Temp Readings from Last 3 Encounters:  01/21/18 98.2 F (36.8 C) (Oral)  12/29/17 98.7 F (37.1 C) (Tympanic)  11/30/17 98.9 F (37.2 C)   BP Readings from Last 3 Encounters:  01/21/18 (!) 132/96  12/29/17 (!) 141/97  11/30/17 112/86   Pulse Readings from Last 3 Encounters:  01/21/18 91  12/29/17 80  11/30/17 73    Physical Exam  Constitutional: She is oriented to person, place, and time and well-developed, well-nourished, and in no distress.  HENT:  Head: Normocephalic and atraumatic.  Mouth/Throat: Oropharynx is clear and moist and mucous membranes are normal.  Eyes: Pupils are equal, round, and reactive to light. Conjunctivae are normal.  Cardiovascular: Normal rate, regular rhythm and normal heart sounds.  Pulmonary/Chest: Effort normal and breath sounds normal.  Musculoskeletal:       Right ankle: She exhibits swelling.       Feet:  Neurological: She is alert and oriented to person, place, and time. Gait normal. Gait normal.  Skin: Skin is warm, dry and  intact.  Psychiatric: Mood, memory, affect and judgment normal.  Tearful on exam today  Nursing note and vitals reviewed.   Assessment   1. Anxiety/depression/insomnia/ADHD 2. HTN uncontrolled  3. H/o Hodgkins lymphoma in remission since 2005  4. Right ankle pain since injury 1 day ago  5. Seasonal allergies  6. HTN  7. HM  8. Hypothyroidism s/p rad tx for hodgkins   Plan   1. On zoloft 50 mg qd  Do PHQ9 at f/u  Rx refills adderall x 3 month supply  At f/u needs to  sign controlled contract and get copy of psych testing  2. Cont chlorthalidone 25 mg qd  3. Advised pt needs f/u with H/[facility] in future at [facility] or [facility] pt to get back with me Check CMET, CBC, TSH, UA, LDH (h/o elevated LDH 07/2017 253), A1C, lipid had 07/28/17 normal given [facility] form to get labs at [facility] 4. RICE  Let me know next week if wants Xray  Prn Tylenol, ibuprofen  5. Flonase, claritin  Consider Astepro. singulair in future vs referral to allergy  6. Cont chlothalidone  May need to add low dose norvasc 2.5 at f/u  7.  Had flu shot 2018  tdap up to date  Consider check hep B status in future   Needs pap in the future ask again when last pap was saw Dr. Leonides Schanz [facility] 05/2017   mammo was ordered 05/2017 by OB/GYN due to h/o hodgkins earlier screening   8. Check TSH on synthryoid 100 mcg qd    "I spent 40 minutes face-to face with patient with greater than 50% of time spent counseling and/or in coordination of care review of history chart review and plan of care as above    Eye MD My eye Doctor  [facility] Dr. Wayland Salinas [facility]  Provider: Dr. Olivia Mackie McLean-Scocuzza-Internal Medicine

## 2018-01-25 ENCOUNTER — Other Ambulatory Visit: Payer: Self-pay | Admitting: Internal Medicine

## 2018-01-25 DIAGNOSIS — C8190 Hodgkin lymphoma, unspecified, unspecified site: Secondary | ICD-10-CM

## 2018-02-11 ENCOUNTER — Encounter: Payer: Self-pay | Admitting: Internal Medicine

## 2018-02-11 ENCOUNTER — Ambulatory Visit: Payer: BLUE CROSS/BLUE SHIELD | Admitting: Internal Medicine

## 2018-02-11 VITALS — BP 116/60 | HR 68 | Temp 98.3°F | Ht 64.0 in | Wt 179.6 lb

## 2018-02-11 DIAGNOSIS — I10 Essential (primary) hypertension: Secondary | ICD-10-CM | POA: Diagnosis not present

## 2018-02-11 DIAGNOSIS — E039 Hypothyroidism, unspecified: Secondary | ICD-10-CM | POA: Diagnosis not present

## 2018-02-11 DIAGNOSIS — Z8571 Personal history of Hodgkin lymphoma: Secondary | ICD-10-CM | POA: Diagnosis not present

## 2018-02-11 DIAGNOSIS — F329 Major depressive disorder, single episode, unspecified: Secondary | ICD-10-CM | POA: Diagnosis not present

## 2018-02-11 DIAGNOSIS — F419 Anxiety disorder, unspecified: Secondary | ICD-10-CM

## 2018-02-11 DIAGNOSIS — F900 Attention-deficit hyperactivity disorder, predominantly inattentive type: Secondary | ICD-10-CM | POA: Diagnosis not present

## 2018-02-11 DIAGNOSIS — L858 Other specified epidermal thickening: Secondary | ICD-10-CM | POA: Diagnosis not present

## 2018-02-11 DIAGNOSIS — F32A Depression, unspecified: Secondary | ICD-10-CM

## 2018-02-11 MED ORDER — CHLORTHALIDONE 25 MG PO TABS: 25.0000 mg | ORAL_TABLET | Freq: Every day | ORAL | 1 refills | Status: DC

## 2018-02-11 MED ORDER — AMPHETAMINE-DEXTROAMPHET ER 20 MG PO CP24: 20.0000 mg | ORAL_CAPSULE | ORAL | 0 refills | Status: DC

## 2018-02-11 MED ORDER — LEVOTHYROXINE SODIUM 100 MCG PO TABS: 100.0000 ug | ORAL_TABLET | Freq: Every day | ORAL | 1 refills | Status: DC

## 2018-02-11 NOTE — Progress Notes (Addendum)
Chief Complaint  Patient presents with  . Follow-up   Follow up  1. Right ankle swelling and pain better  2. H/o hodgkins lymphoma appt with doctor at [facility] 03/2018  3. Still has not gotten labs yet at [facility], needs refills on synthyroid and chlorthalidone 25 mg 4. HTN controlled on above   Review of Systems  Constitutional: Negative for weight loss.  HENT: Negative for hearing loss.   Eyes: Negative for blurred vision.  Respiratory: Negative for shortness of breath.   Musculoskeletal: Negative for joint pain.  Skin: Negative for rash.   Past Medical History:  Diagnosis Date  . Anxiety   . Cancer ([place])   . Depression   . Hodgkin's lymphoma ([place])    dx'ed 2005 tx'ed with chemo/radiation  . Hypertension   . Kidney stones   . Thyroid disease   . UTI (urinary tract infection)    Past Surgical History:  Procedure Laterality Date  . KIDNEY SURGERY     blockage surgery in 2001   . PORT-A-CATH REMOVAL     2005   . TONSILLECTOMY     1987   Family History  Problem Relation Age of Onset  . Depression Mother        postpartum  . Hyperlipidemia Mother   . Cancer Father        lung in remission small cell cancer former smoker   . Depression Father   . Diabetes Father   . Hearing loss Father   . Hypertension Father   . Depression Sister   . ADD / ADHD Sister   . Kidney disease Brother   . ADD / ADHD Brother   . Diabetes Son        DM 1   . Cancer Maternal Grandmother        breast   . Early death Paternal Grandmother   . Stroke Paternal Grandmother   . Eating disorder Sister    Social History   Socioeconomic History  . Marital status: Married    Spouse name: Not on file  . Number of children: Not on file  . Years of education: Not on file  . Highest education level: Not on file  Occupational History  . Not on file  Social Needs  . Financial resource strain: Not on file  . Food insecurity:    Worry: Not on file    Inability: Not on file  . Transportation  needs:    Medical: Not on file    Non-medical: Not on file  Tobacco Use  . Smoking status: Never Smoker  . Smokeless tobacco: Never Used  Substance and Sexual Activity  . Alcohol use: Yes    Comment: occas.  . Drug use: No  . Sexual activity: Yes  Lifestyle  . Physical activity:    Days per week: Not on file    Minutes per session: Not on file  . Stress: Not on file  Relationships  . Social connections:    Talks on phone: Not on file    Gets together: Not on file    Attends religious service: Not on file    Active member of club or organization: Not on file    Attends meetings of clubs or organizations: Not on file    Relationship status: Not on file  . Intimate partner violence:    Fear of current or ex partner: Not on file    Emotionally abused: Not on file    Physically abused: Not on  file    Forced sexual activity: Not on file  Other Topics Concern  . 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 amphetamine-dextroamphetamine (ADDERALL XR) 20 MG 24 hr capsule Take 1 capsule (20 mg total) by mouth every morning.  . chlorthalidone (HYGROTON) 25 MG tablet Take 1 tablet (25 mg total) by mouth daily.  Marland Kitchen levothyroxine (SYNTHROID, LEVOTHROID) 100 MCG tablet Take 1 tablet (100 mcg total) by mouth daily. Take in morning on empty stomach one hour before eating  . Multiple Vitamin (MULTIVITAMIN WITH MINERALS) TABS tablet Take 1 tablet by mouth daily.  . sertraline (ZOLOFT) 50 MG tablet Take 1 tablet (50 mg total) by mouth daily.  . [DISCONTINUED] amphetamine-dextroamphetamine (ADDERALL XR) 20 MG 24 hr capsule Take 1 capsule (20 mg total) by mouth every morning.  . [DISCONTINUED] amphetamine-dextroamphetamine (ADDERALL XR) 20 MG 24 hr capsule Take 1 capsule (20 mg total) by mouth every morning.  .  [DISCONTINUED] chlorthalidone (HYGROTON) 25 MG tablet Take 1 tablet (25 mg total) by mouth daily.  . [DISCONTINUED] levothyroxine (SYNTHROID, LEVOTHROID) 100 MCG tablet Take 1 tablet (100 mcg total) by mouth daily. Take in morning on empty stomach one hour before eating   No Known Allergies No results found for this or any previous visit (from the past 2160 hour(s)). Objective  Body mass index is 30.83 kg/m. Wt Readings from Last 3 Encounters:  02/11/18 179 lb 9.6 oz (81.5 kg)  01/21/18 176 lb (79.8 kg)  12/29/17 177 lb 6.4 oz (80.5 kg)   Temp Readings from Last 3 Encounters:  02/11/18 98.3 F (36.8 C) (Oral)  01/21/18 98.2 F (36.8 C) (Oral)  12/29/17 98.7 F (37.1 C) (Tympanic)   BP Readings from Last 3 Encounters:  02/11/18 116/60  01/21/18 (!) 132/96  12/29/17 (!) 141/97   Pulse Readings from Last 3 Encounters:  02/11/18 68  01/21/18 91  12/29/17 80    Physical Exam  Constitutional: She is oriented to person, place, and time. Vital signs are normal. She appears well-developed and well-nourished.  HENT:  Head: Normocephalic and atraumatic.  Left Ear: External ear normal.  Eyes: Pupils are equal, round, and reactive to light. Conjunctivae are normal.  Cardiovascular: Normal rate, regular rhythm and normal heart sounds.  Pulmonary/Chest: Effort normal and breath sounds normal.  Musculoskeletal:       Feet:  Neurological: She is alert and oriented to person, place, and time. Gait normal.  Skin: Skin is warm, dry and intact.  Psychiatric: She has a normal mood and affect. Her speech is normal and behavior is normal. Judgment and thought content normal. Cognition and memory are normal.  Nursing note and vitals reviewed.   Assessment   1. Right ankle pain improved  2. hodgkins lymphoma  3. HTN 4. Hypothyroidism  5. HM 6. ADHD 7. Keratosis pilaris to arms and legs  Plan  1. ntd 2. F/u H/O 03/2018  3. Cont chlorthalidone 25 mg qd  4. Cont synthyroid 100  5.    Had flu shot 2018  tdap up to date  Consider check hep B status in future   Needs pap in the future ask again when last pap was saw Dr. Prentice Docker OB[facility] 05/2017  -need to get records   mammo  was ordered 05/2017 by OB/GYN due to h/o hodgkins earlier screening has not had yet   Of note she still has not had labs done at [facility] has orders   6. Signed controlled contract today  Need to get records from Dr. Dannial Monarch Oh [phone] phone #[facility] psych testing per pt had 4 years ago   7. Printed info from [facility] on keratosis pilaris, she thinks she may have SSA at home and also disc exfoliation and amlactin lotion otc     Provider: Dr. Olivia Mackie McLean-Scocuzza-Internal Medicine

## 2018-02-11 NOTE — Progress Notes (Signed)
Pre visit review using our clinic review tool, if applicable. No additional management support is needed unless otherwise documented below in the visit note. 

## 2018-02-11 NOTE — Patient Instructions (Addendum)
Amlactin lotion is another treatment for keratosis Pilaris  Follow up in 3 months sooner if needed  Please read information about keratosis pilaris

## 2018-02-12 DIAGNOSIS — L858 Other specified epidermal thickening: Secondary | ICD-10-CM | POA: Insufficient documentation

## 2018-02-23 ENCOUNTER — Ambulatory Visit: Payer: Self-pay | Admitting: Medical

## 2018-02-23 ENCOUNTER — Encounter: Payer: Self-pay | Admitting: Medical

## 2018-02-23 VITALS — BP 139/93 | HR 83 | Temp 98.4°F | Resp 16 | Wt 177.2 lb

## 2018-02-23 DIAGNOSIS — J0100 Acute maxillary sinusitis, unspecified: Secondary | ICD-10-CM

## 2018-02-23 MED ORDER — AMOXICILLIN-POT CLAVULANATE 875-125 MG PO TABS: 1.0000 | ORAL_TABLET | Freq: Two times a day (BID) | ORAL | 0 refills | Status: DC

## 2018-02-23 NOTE — Progress Notes (Signed)
   Subjective:    Patient ID: Shellia Carwin, female    DOB: 1981/09/05, 37 y.o.   MRN: [id number]  HPI 37 yo female in non acute distress. Complains of yellow /green drainage since Wednesday of last week.  nasal congestion about  1 week, [employer]  Three days.  Husband seen by Urgent Care and tested for strep throat.  Tylenol cold and flu 9 am today.   Review of Systems  Constitutional: Positive for appetite change (decreassed), diaphoresis (this morning) and fever ("maybe"). Negative for chills.  HENT: Positive for congestion, ear pain ("just pressure on the right side), postnasal drip, rhinorrhea, sinus pressure, sinus pain and sore throat. Negative for sneezing.   Respiratory: Positive for cough and shortness of breath.   Cardiovascular: Negative for chest pain, palpitations and leg swelling.  Gastrointestinal: Negative for abdominal pain, diarrhea, nausea and vomiting.  Endocrine: Negative for polydipsia, polyphagia and polyuria.  Genitourinary: Negative for dysuria.  Musculoskeletal: Positive for myalgias.  Skin: Negative for rash.  Allergic/Immunologic: Positive for environmental allergies. Negative for food allergies.  Neurological: Positive for dizziness (this morning felt bed). Negative for syncope and light-headedness.  Hematological: Positive for adenopathy (right side of neck).  Psychiatric/Behavioral: Negative for behavioral problems, self-injury and suicidal ideas. The patient is not nervous/anxious.        Objective:   Physical Exam  Constitutional: She is oriented to person, place, and time. She appears well-developed and well-nourished.  HENT:  Head: Normocephalic and atraumatic.  Right Ear: Hearing, external ear and ear canal normal. Tympanic membrane is erythematous.  Left Ear: Hearing, external ear and ear canal normal. A middle ear effusion is present.  Nose: Mucosal edema (right side) and rhinorrhea present. Right sinus exhibits maxillary sinus tenderness  and frontal sinus tenderness.  Mouth/Throat: Uvula is midline and mucous membranes are normal. Uvula swelling present. Posterior oropharyngeal edema and posterior oropharyngeal erythema present. No oropharyngeal exudate. Tonsils are 0 on the right. Tonsils are 0 on the left.  Eyes: Pupils are equal, round, and reactive to light. Conjunctivae and EOM are normal.  Neck: Normal range of motion. Neck supple.  Cardiovascular: Normal rate, regular rhythm and normal heart sounds.  Pulmonary/Chest: Effort normal and breath sounds normal.  Lymphadenopathy:    She has cervical adenopathy.  Neurological: She is alert and oriented to person, place, and time.  Skin: Skin is warm and dry.  Psychiatric: She has a normal mood and affect. Her behavior is normal. Judgment and thought content normal.  Nursing note and vitals reviewed.         Assessment & Plan:  Sinusitis, pharyngitis/ otitis media right Rest , increase fluids, monitor and treat fever and pain with plain Tylenol. She is thankful for our care. Meds ordered this encounter  Medications  . amoxicillin-clavulanate (AUGMENTIN) 875-125 MG tablet    Sig: Take 1 tablet by mouth 2 (two) times daily.    Dispense:  20 tablet    Refill:  0   return in 3-5 days if not improving,  Patient verbalizes understanding and has no questions at discharge.

## 2018-02-23 NOTE — Patient Instructions (Signed)
Pharyngitis Pharyngitis is a sore throat (pharynx). There is redness, pain, and swelling of your throat. Follow these instructions at home:  Drink enough fluids to keep your pee (urine) clear or pale yellow.  Only take medicine as told by your doctor. ? You may get sick again if you do not take medicine as told. Finish your medicines, even if you start to feel better. ? Do not take aspirin.  Rest.  Rinse your mouth (gargle) with salt water ( tsp of salt per 1 qt of water) every 1-2 hours. This will help the pain.  If you are not at risk for choking, you can suck on hard candy or sore throat lozenges. Contact a doctor if:  You have large, tender lumps on your neck.  You have a rash.  You cough up green, yellow-brown, or bloody spit. Get help right away if:  You have a stiff neck.  You drool or cannot swallow liquids.  You throw up (vomit) or are not able to keep medicine or liquids down.  You have very bad pain that does not go away with medicine.  You have problems breathing (not from a stuffy nose). This information is not intended to replace advice given to you by your health care provider. Make sure you discuss any questions you have with your health care provider. Document Released: 03/31/2008 Document Revised: 03/20/2016 Document Reviewed: 06/20/2013 Elsevier Interactive Patient Education  2017 Elsevier Inc. Sinusitis, Adult Sinusitis is soreness and inflammation of your sinuses. Sinuses are hollow spaces in the bones around your face. They are located:  Around your eyes.  In the middle of your forehead.  Behind your nose.  In your cheekbones.  Your sinuses and nasal passages are lined with a stringy fluid (mucus). Mucus normally drains out of your sinuses. When your nasal tissues get inflamed or swollen, the mucus can get trapped or blocked so air cannot flow through your sinuses. This lets bacteria, viruses, and funguses grow, and that leads to  infection. Follow these instructions at home: Medicines  Take, use, or apply over-the-counter and prescription medicines only as told by your doctor. These may include nasal sprays.  If you were prescribed an antibiotic medicine, take it as told by your doctor. Do not stop taking the antibiotic even if you start to feel better. Hydrate and Humidify  Drink enough water to keep your pee (urine) clear or pale yellow.  Use a cool mist humidifier to keep the humidity level in your home above 50%.  Breathe in steam for 10-15 minutes, 3-4 times a day or as told by your doctor. You can do this in the bathroom while a hot shower is running.  Try not to spend time in cool or dry air. Rest  Rest as much as possible.  Sleep with your head raised (elevated).  Make sure to get enough sleep each night. General instructions  Put a warm, moist washcloth on your face 3-4 times a day or as told by your doctor. This will help with discomfort.  Wash your hands often with soap and water. If there is no soap and water, use hand sanitizer.  Do not smoke. Avoid being around people who are smoking (secondhand smoke).  Keep all follow-up visits as told by your doctor. This is important. Contact a doctor if:  You have a fever.  Your symptoms get worse.  Your symptoms do not get better within 10 days. Get help right away if:  You have a very bad  headache.  You cannot stop throwing up (vomiting).  You have pain or swelling around your face or eyes.  You have trouble seeing.  You feel confused.  Your neck is stiff.  You have trouble breathing. This information is not intended to replace advice given to you by your health care provider. Make sure you discuss any questions you have with your health care provider. Document Released: 03/31/2008 Document Revised: 06/08/2016 Document Reviewed: 08/08/2015 Elsevier Interactive Patient Education  [employer].

## 2018-03-06 ENCOUNTER — Other Ambulatory Visit: Payer: Self-pay | Admitting: Medical

## 2018-03-06 DIAGNOSIS — F419 Anxiety disorder, unspecified: Secondary | ICD-10-CM

## 2018-05-05 DIAGNOSIS — Z923 Personal history of irradiation: Secondary | ICD-10-CM | POA: Diagnosis not present

## 2018-05-05 DIAGNOSIS — E038 Other specified hypothyroidism: Secondary | ICD-10-CM | POA: Diagnosis not present

## 2018-05-05 DIAGNOSIS — F411 Generalized anxiety disorder: Secondary | ICD-10-CM | POA: Diagnosis not present

## 2018-05-05 DIAGNOSIS — Z8571 Personal history of Hodgkin lymphoma: Secondary | ICD-10-CM | POA: Diagnosis not present

## 2018-05-05 DIAGNOSIS — I10 Essential (primary) hypertension: Secondary | ICD-10-CM | POA: Diagnosis not present

## 2018-05-11 ENCOUNTER — Telehealth: Payer: Self-pay

## 2018-05-12 ENCOUNTER — Telehealth: Payer: Self-pay | Admitting: Internal Medicine

## 2018-05-12 NOTE — Telephone Encounter (Signed)
Fax cover sheet to Dr. Ernst Breach [facility] [phone] with message below and copy of labs Last labs were 07/28/17 not w/in 6 months Fax copy of labs from 07/28/17  Chem profile, lipid, cbc, vitamin D, and thyroid labs

## 2018-05-12 NOTE — Telephone Encounter (Signed)
Information has been faxed electronically. 

## 2018-05-13 ENCOUNTER — Ambulatory Visit: Payer: BLUE CROSS/BLUE SHIELD | Admitting: Internal Medicine

## 2018-05-13 ENCOUNTER — Encounter: Payer: Self-pay | Admitting: Internal Medicine

## 2018-05-13 VITALS — BP 140/92 | HR 83 | Temp 98.0°F | Ht 64.0 in | Wt 177.4 lb

## 2018-05-13 DIAGNOSIS — Z124 Encounter for screening for malignant neoplasm of cervix: Secondary | ICD-10-CM

## 2018-05-13 DIAGNOSIS — F419 Anxiety disorder, unspecified: Secondary | ICD-10-CM

## 2018-05-13 DIAGNOSIS — G4700 Insomnia, unspecified: Secondary | ICD-10-CM

## 2018-05-13 DIAGNOSIS — F32A Depression, unspecified: Secondary | ICD-10-CM

## 2018-05-13 DIAGNOSIS — E039 Hypothyroidism, unspecified: Secondary | ICD-10-CM | POA: Diagnosis not present

## 2018-05-13 DIAGNOSIS — F329 Major depressive disorder, single episode, unspecified: Secondary | ICD-10-CM

## 2018-05-13 DIAGNOSIS — I10 Essential (primary) hypertension: Secondary | ICD-10-CM

## 2018-05-13 DIAGNOSIS — F900 Attention-deficit hyperactivity disorder, predominantly inattentive type: Secondary | ICD-10-CM

## 2018-05-13 DIAGNOSIS — Z8571 Personal history of Hodgkin lymphoma: Secondary | ICD-10-CM | POA: Diagnosis not present

## 2018-05-13 MED ORDER — LOSARTAN POTASSIUM 25 MG PO TABS: 25.0000 mg | ORAL_TABLET | Freq: Every day | ORAL | 3 refills | Status: DC

## 2018-05-13 MED ORDER — SERTRALINE HCL 50 MG PO TABS: 50.0000 mg | ORAL_TABLET | Freq: Every day | ORAL | 3 refills | Status: DC

## 2018-05-13 MED ORDER — LEVOTHYROXINE SODIUM 100 MCG PO TABS: 100.0000 ug | ORAL_TABLET | Freq: Every day | ORAL | 3 refills | Status: DC

## 2018-05-13 MED ORDER — CHLORTHALIDONE 25 MG PO TABS: 25.0000 mg | ORAL_TABLET | Freq: Every day | ORAL | 3 refills | Status: DC

## 2018-05-13 MED ORDER — AMPHETAMINE-DEXTROAMPHET ER 20 MG PO CP24: 20.0000 mg | ORAL_CAPSULE | ORAL | 0 refills | Status: DC

## 2018-05-13 NOTE — Progress Notes (Addendum)
Chief Complaint  Patient presents with  . Follow-up   F/u  1. HTN uncontrolled on chlorthalidone 25 will start ARB 2. Doing well not complaints mammo and b/l MRI sch 06/17/18 [facility] and will refer Dr. Leonides Schanz for pap 3. ADHD need to get proof psych testing and 06/30/18 has appt sleep therapist  4. H/o lymphoma pending echo 06/30/18     Review of Systems  Constitutional: Negative for weight loss.  HENT: Negative for hearing loss.   Eyes: Negative for blurred vision.  Respiratory: Negative for shortness of breath.   Cardiovascular: Negative for chest pain.  Gastrointestinal: Negative for abdominal pain.  Skin: Negative for rash.  Neurological: Negative for headaches.  Psychiatric/Behavioral: Negative for depression.   Past Medical History:  Diagnosis Date  . Anxiety   . Cancer ([place])   . Depression   . Hodgkin's lymphoma ([place])    dx'ed 2005 tx'ed with chemo/radiation  . Hypertension   . Kidney stones   . Thyroid disease   . UTI (urinary tract infection)    Past Surgical History:  Procedure Laterality Date  . KIDNEY SURGERY     blockage surgery in 2001   . PORT-A-CATH REMOVAL     2005   . TONSILLECTOMY     1987   Family History  Problem Relation Age of Onset  . Depression Mother        postpartum  . Hyperlipidemia Mother   . Cancer Father        lung in remission small cell cancer former smoker   . Depression Father   . Diabetes Father   . Hearing loss Father   . Hypertension Father   . Depression Sister   . ADD / ADHD Sister   . Kidney disease Brother   . ADD / ADHD Brother   . Diabetes Son        DM 1   . Cancer Maternal Grandmother        breast   . Early death Paternal Grandmother   . Stroke Paternal Grandmother   . Eating disorder Sister    Social History   Socioeconomic History  . Marital status: Married    Spouse name: Not on file  . Number of children: Not on file  . Years of education: Not on file  . Highest education level: Not on file   Occupational History  . Not on file  Social Needs  . Financial resource strain: Not on file  . Food insecurity:    Worry: Not on file    Inability: Not on file  . Transportation needs:    Medical: Not on file    Non-medical: Not on file  Tobacco Use  . Smoking status: Never Smoker  . Smokeless tobacco: Never Used  Substance and Sexual Activity  . Alcohol use: Yes    Comment: occas.  . Drug use: No  . Sexual activity: Yes  Lifestyle  . Physical activity:    Days per week: Not on file    Minutes per session: Not on file  . Stress: Not on file  Relationships  . Social connections:    Talks on phone: Not on file    Gets together: Not on file    Attends religious service: Not on file    Active member of club or organization: Not on file    Attends meetings of clubs or organizations: Not on file    Relationship status: Not on file  . Intimate partner violence:  Fear of current or ex partner: Not on file    Emotionally abused: Not on file    Physically abused: Not on file    Forced sexual activity: Not on file  Other Topics Concern  . 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 amphetamine-dextroamphetamine (ADDERALL XR) 20 MG 24 hr capsule Take 1 capsule (20 mg total) by mouth every morning.  . chlorthalidone (HYGROTON) 25 MG tablet Take 1 tablet (25 mg total) by mouth daily.  Marland Kitchen levothyroxine (SYNTHROID, LEVOTHROID) 100 MCG tablet Take 1 tablet (100 mcg total) by mouth daily. Take in morning on empty stomach one hour before eating  . Multiple Vitamin (MULTIVITAMIN WITH MINERALS) TABS tablet Take 1 tablet by mouth daily.  . sertraline (ZOLOFT) 50 MG tablet Take 1 tablet (50 mg total) by mouth daily.  . [DISCONTINUED] amphetamine-dextroamphetamine (ADDERALL XR) 20 MG 24 hr capsule Take 1 capsule (20  mg total) by mouth every morning.  . [DISCONTINUED] chlorthalidone (HYGROTON) 25 MG tablet Take 1 tablet (25 mg total) by mouth daily.  . [DISCONTINUED] levothyroxine (SYNTHROID, LEVOTHROID) 100 MCG tablet Take 1 tablet (100 mcg total) by mouth daily. Take in morning on empty stomach one hour before eating  . [DISCONTINUED] Phenylephrine-DM-GG-APAP (TYLENOL COLD/FLU SEVERE PO) Take by mouth.  . [DISCONTINUED] sertraline (ZOLOFT) 50 MG tablet Take 1 tablet (50 mg total) by mouth daily.   No Known Allergies No results found for this or any previous visit (from the past 2160 hour(s)). Objective  Body mass index is 30.45 kg/m. Wt Readings from Last 3 Encounters:  05/13/18 177 lb 6.4 oz (80.5 kg)  02/23/18 177 lb 3.2 oz (80.4 kg)  02/11/18 179 lb 9.6 oz (81.5 kg)   Temp Readings from Last 3 Encounters:  05/13/18 98 F (36.7 C) (Oral)  02/23/18 98.4 F (36.9 C) (Tympanic)  02/11/18 98.3 F (36.8 C) (Oral)   BP Readings from Last 3 Encounters:  05/13/18 (!) 140/92  02/23/18 (!) 139/93  02/11/18 116/60   Pulse Readings from Last 3 Encounters:  05/13/18 83  02/23/18 83  02/11/18 68    Physical Exam  Constitutional: She is oriented to person, place, and time. Vital signs are normal. She appears well-developed and well-nourished. She is cooperative.  HENT:  Head: Normocephalic and atraumatic.  Mouth/Throat: Oropharynx is clear and moist and mucous membranes are normal.  Eyes: Pupils are equal, round, and reactive to light. Conjunctivae are normal.  Cardiovascular: Normal rate, regular rhythm and normal heart sounds.  Pulmonary/Chest: Effort normal and breath sounds normal.  Neurological: She is alert and oriented to person, place, and time. Gait normal.  Skin: Skin is warm, dry and intact.  Psychiatric: She has a normal mood and affect. Her speech is normal and behavior is normal. Judgment and thought content normal. Cognition and memory are normal.  Nursing note and vitals  reviewed.   Assessment   1. HTN  2. ADHD  3. H/o lymphoma  4. HM  Plan   1.  Add losartan 25 mg qd  2.  Refilled meds  Psychological testing 06/21/15 ADHD and has been on Adderral 20 mg XR qd  Will have pt sign controlled substance contract at f/u  3.  Pending echo, mammo/MRI 4.  Had flu  and Tdap  Pending MRI/mammo Referred Dr. Leonides Schanz [facility]  Dermatology saw spring 2019  Fasting labs given form [facility], CBC, lipid, TSH, T4, vit D, UA, hep B, MMR, declines HIV  Need copy of psychological testing see last note    Provider: Dr. Olivia Mackie [facility]

## 2018-05-13 NOTE — Patient Instructions (Addendum)
Please be fasting  Referred to Dr. Leonides Schanz for pap  F/u in 4 months Take care   Losartan tablets What is this medicine? LOSARTAN (loe SAR tan) is used to treat high blood pressure and to reduce the risk of stroke in certain patients. This drug also slows the progression of kidney disease in patients with diabetes. This medicine may be used for other purposes; ask your health care provider or pharmacist if you have questions. COMMON BRAND NAME(S): Cozaar What should I tell my health care provider before I take this medicine? They need to know if you have any of these conditions: -heart failure -kidney or liver disease -an unusual or allergic reaction to losartan, other medicines, foods, dyes, or preservatives -pregnant or trying to get pregnant -breast-feeding How should I use this medicine? Take this medicine by mouth with a glass of water. Follow the directions on the prescription label. This medicine can be taken with or without food. Take your doses at regular intervals. Do not take your medicine more often than directed. Talk to your pediatrician regarding the use of this medicine in children. Special care may be needed. Overdosage: If you think you have taken too much of this medicine contact a poison control center or emergency room at once. NOTE: This medicine is only for you. Do not share this medicine with others. What if I miss a dose? If you miss a dose, take it as soon as you can. If it is almost time for your next dose, take only that dose. Do not take double or extra doses. What may interact with this medicine? -blood pressure medicines -diuretics, especially triamterene, spironolactone, or amiloride -fluconazole -NSAIDs, medicines for pain and inflammation, like ibuprofen or naproxen -potassium salts or potassium supplements -rifampin This list may not describe all possible interactions. Give your health care provider a list of all the medicines, herbs, non-prescription  drugs, or dietary supplements you use. Also tell them if you smoke, drink alcohol, or use illegal drugs. Some items may interact with your medicine. What should I watch for while using this medicine? Visit your doctor or health care professional for regular checks on your progress. Check your blood pressure as directed. Ask your doctor or health care professional what your blood pressure should be and when you should contact him or her. Call your doctor or health care professional if you notice an irregular or fast heart beat. Women should inform their doctor if they wish to become pregnant or think they might be pregnant. There is a potential for serious side effects to an unborn child, particularly in the second or third trimester. Talk to your health care professional or pharmacist for more information. You may get drowsy or dizzy. Do not drive, use machinery, or do anything that needs mental alertness until you know how this drug affects you. Do not stand or sit up quickly, especially if you are an older patient. This reduces the risk of dizzy or fainting spells. Alcohol can make you more drowsy and dizzy. Avoid alcoholic drinks. Avoid salt substitutes unless you are told otherwise by your doctor or health care professional. Do not treat yourself for coughs, colds, or pain while you are taking this medicine without asking your doctor or health care professional for advice. Some ingredients may increase your blood pressure. What side effects may I notice from receiving this medicine? Side effects that you should report to your doctor or health care professional as soon as possible: -confusion, dizziness, light headedness or  fainting spells -decreased amount of urine passed -difficulty breathing or swallowing, hoarseness, or tightening of the throat -fast or irregular heart beat, palpitations, or chest pain -skin rash, itching -swelling of your face, lips, tongue, hands, or feet Side effects that  usually do not require medical attention (report to your doctor or health care professional if they continue or are bothersome): -cough -decreased sexual function or desire -headache -nasal congestion or stuffiness -nausea or stomach pain -sore or cramping muscles This list may not describe all possible side effects. Call your doctor for medical advice about side effects. You may report side effects to FDA at 1-800-FDA-1088. Where should I keep my medicine? Keep out of the reach of children. Store at room temperature between 15 and 30 degrees C (59 and 86 degrees F). Protect from light. Keep container tightly closed. Throw away any unused medicine after the expiration date. NOTE: This sheet is a summary. It may not cover all possible information. If you have questions about this medicine, talk to your doctor, pharmacist, or health care provider.  2018 Elsevier/Gold Standard (2007-12-24 16:42:18)

## 2018-05-13 NOTE — Progress Notes (Signed)
Pre visit review using our clinic review tool, if applicable. No additional management support is needed unless otherwise documented below in the visit note. 

## 2018-05-18 ENCOUNTER — Other Ambulatory Visit: Payer: Self-pay

## 2018-05-18 DIAGNOSIS — Z0000 Encounter for general adult medical examination without abnormal findings: Secondary | ICD-10-CM

## 2018-05-19 LAB — LIPID PANEL
CHOL/HDL RATIO: 2 ratio (ref 0.0–4.4)
CHOLESTEROL TOTAL: 173 mg/dL (ref 100–199)
HDL: 88 mg/dL (ref >39)
LDL CALC: 69 mg/dL (ref 0–99)
Triglycerides: 82 mg/dL (ref 0–149)
VLDL CHOLESTEROL CAL: 16 mg/dL (ref 5–40)

## 2018-05-19 LAB — COMPREHENSIVE METABOLIC PANEL
ALK PHOS: 70 IU/L (ref 39–117)
ALT: 16 IU/L (ref 0–32)
AST: 20 IU/L (ref 0–40)
Albumin/Globulin Ratio: 1.8 (ref 1.2–2.2)
Albumin: 4.4 g/dL (ref 3.5–5.5)
BILIRUBIN TOTAL: 0.4 mg/dL (ref 0.0–1.2)
BUN/Creatinine Ratio: 18 (ref 9–23)
BUN: 14 mg/dL (ref 6–20)
CHLORIDE: 98 mmol/L (ref 96–106)
CO2: 22 mmol/L (ref 20–29)
Calcium: 9.4 mg/dL (ref 8.7–10.2)
Creatinine, Ser: 0.78 mg/dL (ref 0.57–1.00)
GFR calc non Af Amer: 97 mL/min/{1.73_m2} (ref >59)
GFR, EST AFRICAN AMERICAN: 112 mL/min/{1.73_m2} (ref >59)
GLUCOSE: 100 mg/dL — AB (ref 65–99)
Globulin, Total: 2.5 g/dL (ref 1.5–4.5)
Potassium: 3.7 mmol/L (ref 3.5–5.2)
Sodium: 138 mmol/L (ref 134–144)
TOTAL PROTEIN: 6.9 g/dL (ref 6.0–8.5)

## 2018-05-19 LAB — CBC WITH DIFFERENTIAL/PLATELET
BASOS ABS: 0.1 10*3/uL (ref 0.0–0.2)
BASOS: 1 %
EOS (ABSOLUTE): 0.2 10*3/uL (ref 0.0–0.4)
Eos: 2 %
Hematocrit: 43.8 % (ref 34.0–46.6)
Hemoglobin: 15 g/dL (ref 11.1–15.9)
IMMATURE GRANS (ABS): 0 10*3/uL (ref 0.0–0.1)
Immature Granulocytes: 0 %
LYMPHS: 30 %
Lymphocytes Absolute: 2.5 10*3/uL (ref 0.7–3.1)
MCH: 29.6 pg (ref 26.6–33.0)
MCHC: 34.2 g/dL (ref 31.5–35.7)
MCV: 87 fL (ref 79–97)
MONOS ABS: 0.6 10*3/uL (ref 0.1–0.9)
Monocytes: 8 %
NEUTROS PCT: 59 %
Neutrophils Absolute: 4.8 10*3/uL (ref 1.4–7.0)
PLATELETS: 341 10*3/uL (ref 150–450)
RBC: 5.06 x10E6/uL (ref 3.77–5.28)
RDW: 13.9 % (ref 12.3–15.4)
WBC: 8.1 10*3/uL (ref 3.4–10.8)

## 2018-05-19 LAB — URINALYSIS, ROUTINE W REFLEX MICROSCOPIC
Bilirubin, UA: NEGATIVE
GLUCOSE, UA: NEGATIVE
Ketones, UA: NEGATIVE
LEUKOCYTES UA: NEGATIVE
Nitrite, UA: NEGATIVE
Specific Gravity, UA: 1.015 (ref 1.005–1.030)
Urobilinogen, Ur: 0.2 mg/dL (ref 0.2–1.0)
pH, UA: 5.5 (ref 5.0–7.5)

## 2018-05-19 LAB — MEASLES/MUMPS/RUBELLA IMMUNITY
MUMPS ABS, IGG: 54.8 AU/mL (ref >10.9)
RUBELLA: 3.13 {index} (ref >0.99)

## 2018-05-19 LAB — TSH: TSH: 0.709 u[IU]/mL (ref 0.450–4.500)

## 2018-05-19 LAB — MICROSCOPIC EXAMINATION: CASTS: NONE SEEN /LPF

## 2018-05-19 LAB — T4, FREE: FREE T4: 1.49 ng/dL (ref 0.82–1.77)

## 2018-05-19 LAB — VITAMIN D 25 HYDROXY (VIT D DEFICIENCY, FRACTURES): Vit D, 25-Hydroxy: 37.4 ng/mL (ref 30.0–100.0)

## 2018-05-19 LAB — HEPATITIS B SURFACE ANTIBODY, QUANTITATIVE: HEPATITIS B SURF AB QUANT: 303.5 m[IU]/mL

## 2018-06-17 DIAGNOSIS — Z923 Personal history of irradiation: Secondary | ICD-10-CM | POA: Diagnosis not present

## 2018-06-17 DIAGNOSIS — Z1231 Encounter for screening mammogram for malignant neoplasm of breast: Secondary | ICD-10-CM | POA: Diagnosis not present

## 2018-06-17 DIAGNOSIS — Z8571 Personal history of Hodgkin lymphoma: Secondary | ICD-10-CM | POA: Diagnosis not present

## 2018-06-17 DIAGNOSIS — Z1239 Encounter for other screening for malignant neoplasm of breast: Secondary | ICD-10-CM | POA: Diagnosis not present

## 2018-06-17 NOTE — Telephone Encounter (Signed)
Error

## 2018-06-30 DIAGNOSIS — Z8571 Personal history of Hodgkin lymphoma: Secondary | ICD-10-CM | POA: Diagnosis not present

## 2018-06-30 DIAGNOSIS — Z923 Personal history of irradiation: Secondary | ICD-10-CM | POA: Diagnosis not present

## 2018-06-30 DIAGNOSIS — Z658 Other specified problems related to psychosocial circumstances: Secondary | ICD-10-CM | POA: Diagnosis not present

## 2018-06-30 DIAGNOSIS — F411 Generalized anxiety disorder: Secondary | ICD-10-CM | POA: Diagnosis not present

## 2018-06-30 DIAGNOSIS — I340 Nonrheumatic mitral (valve) insufficiency: Secondary | ICD-10-CM | POA: Diagnosis not present

## 2018-06-30 DIAGNOSIS — I519 Heart disease, unspecified: Secondary | ICD-10-CM | POA: Diagnosis not present

## 2018-06-30 DIAGNOSIS — I10 Essential (primary) hypertension: Secondary | ICD-10-CM | POA: Diagnosis not present

## 2018-07-21 DIAGNOSIS — F4323 Adjustment disorder with mixed anxiety and depressed mood: Secondary | ICD-10-CM | POA: Diagnosis not present

## 2018-07-21 DIAGNOSIS — Z658 Other specified problems related to psychosocial circumstances: Secondary | ICD-10-CM | POA: Diagnosis not present

## 2018-07-28 DIAGNOSIS — F411 Generalized anxiety disorder: Secondary | ICD-10-CM | POA: Diagnosis not present

## 2018-08-03 DIAGNOSIS — D2261 Melanocytic nevi of right upper limb, including shoulder: Secondary | ICD-10-CM | POA: Diagnosis not present

## 2018-08-03 DIAGNOSIS — D2272 Melanocytic nevi of left lower limb, including hip: Secondary | ICD-10-CM | POA: Diagnosis not present

## 2018-08-03 DIAGNOSIS — D2262 Melanocytic nevi of left upper limb, including shoulder: Secondary | ICD-10-CM | POA: Diagnosis not present

## 2018-08-03 DIAGNOSIS — D225 Melanocytic nevi of trunk: Secondary | ICD-10-CM | POA: Diagnosis not present

## 2018-08-04 DIAGNOSIS — F411 Generalized anxiety disorder: Secondary | ICD-10-CM | POA: Diagnosis not present

## 2018-08-09 IMAGING — CT CT MAXILLOFACIAL W/O CM
4 of 10 series · 16 of 47 positions shown, 18 images · non-contrast
Comparison: None.

CLINICAL DATA: Syncopal episode this morning with closed head
injury. Left supraorbital laceration. History of Hodgkin lymphoma

EXAM:
CT HEAD WITHOUT CONTRAST
CT MAXILLOFACIAL WITHOUT CONTRAST
CT CERVICAL SPINE WITHOUT CONTRAST
TECHNIQUE: Multidetector CT imaging of the head, cervical spine, and
maxillofacial structures were performed using the standard protocol
without intravenous contrast. Multiplanar CT image reconstructions
of the cervical spine and maxillofacial structures were also
generated.

[Series 7: c spine soft · axial · 0.33mm/px · z∈[+151,+233]mm · 5 of 84 slices shown]
[im 11/84  brain]
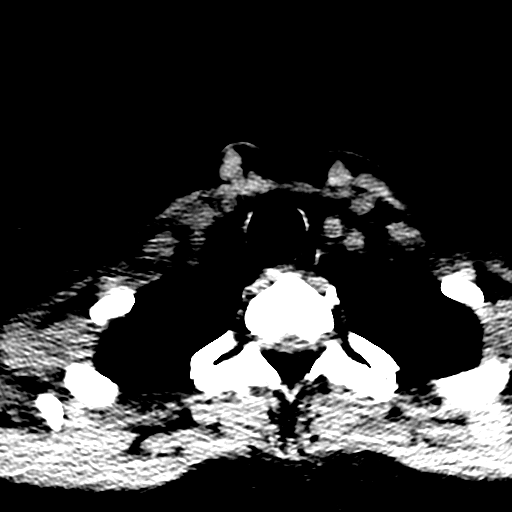
[im 21/84  brain]
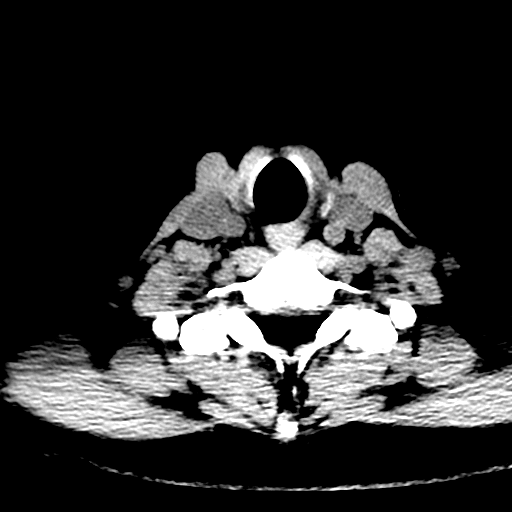
[im 32/84  brain]
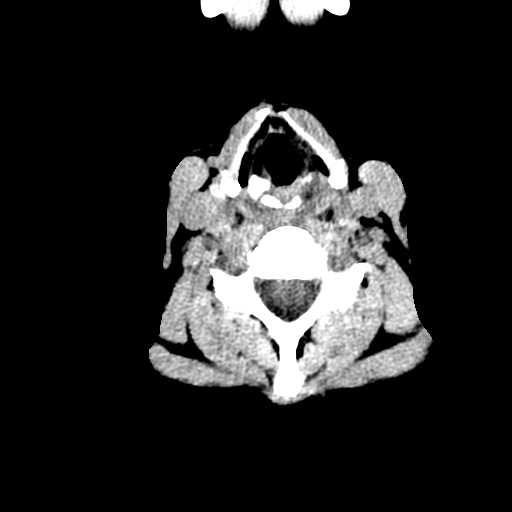
[im 42/84  brain]
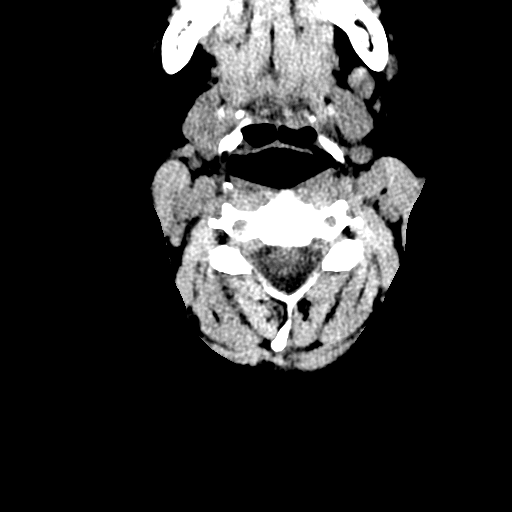
[im 52/84  brain]
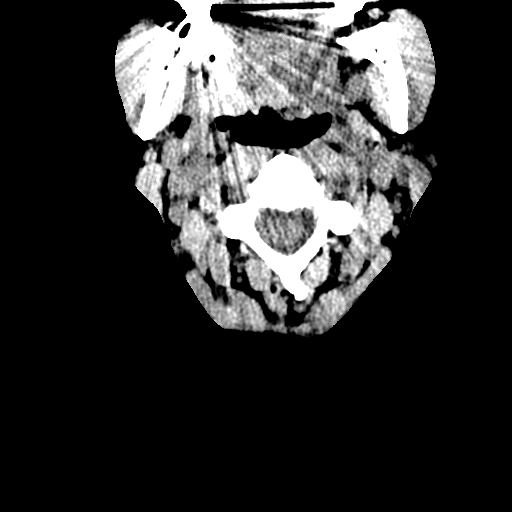

[Series 10: orthogonal axials · axial · 0.23mm/px · z∈[+110,+240]mm · 8 of 100 slices shown, 10 images]
[im 12/100  brain]
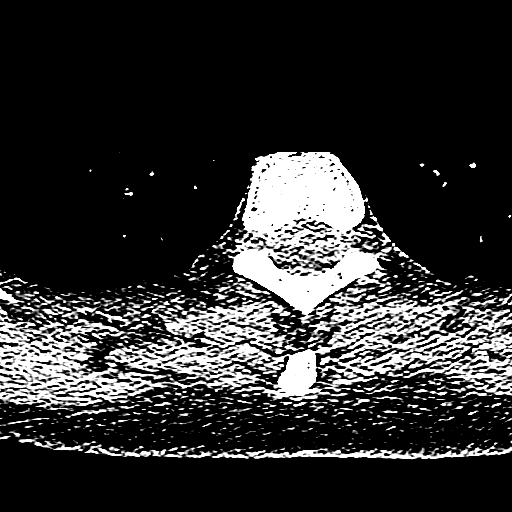
[im 12/100  bone]
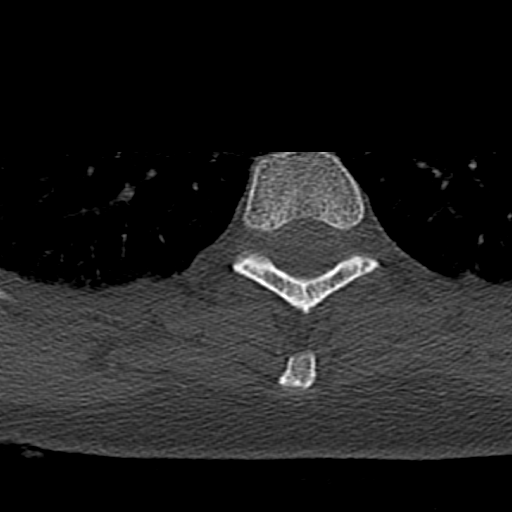
[im 23/100  bone]
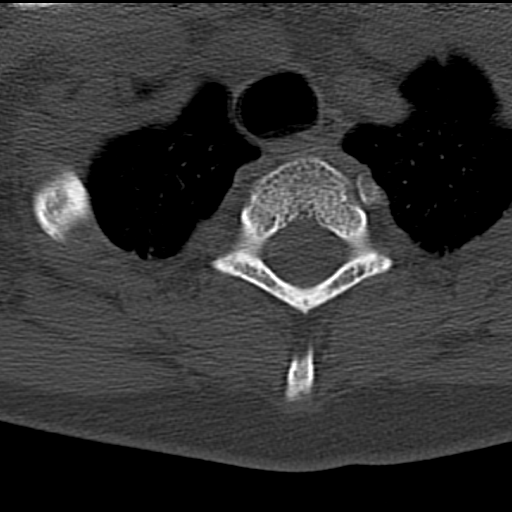
[im 34/100  bone]
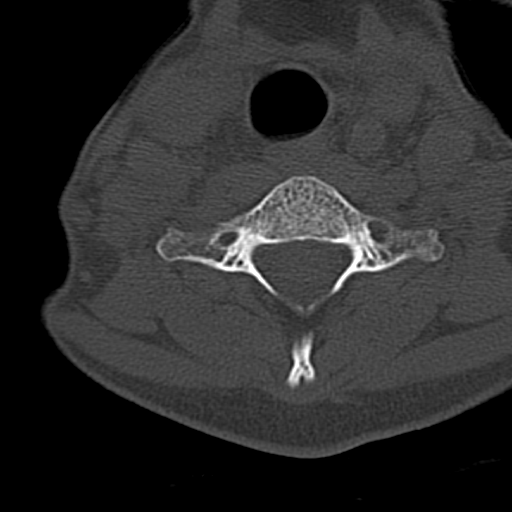
[im 45/100  bone]
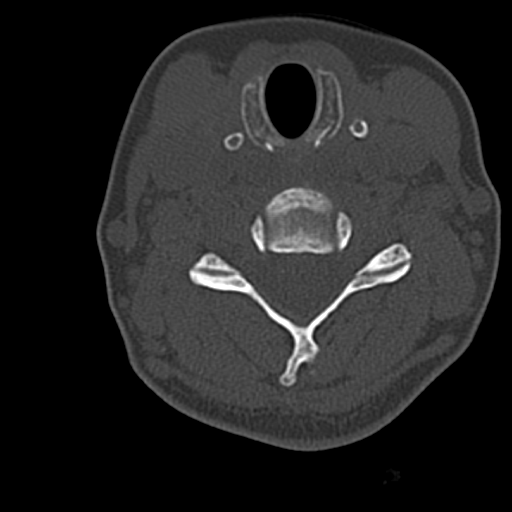
[im 56/100  brain]
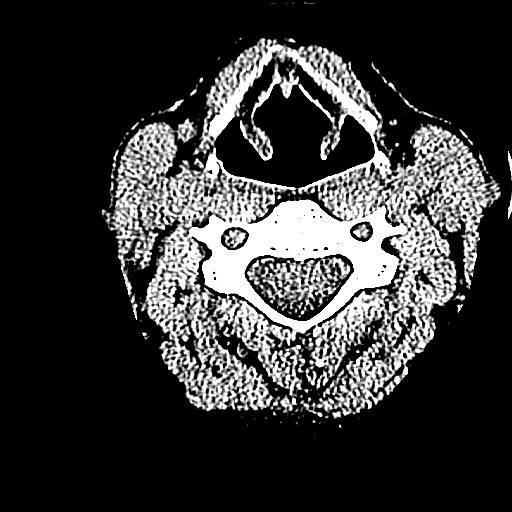
[im 56/100  bone]
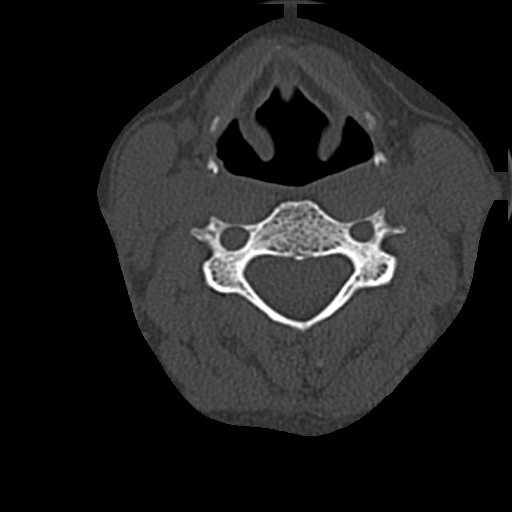
[im 67/100  bone]
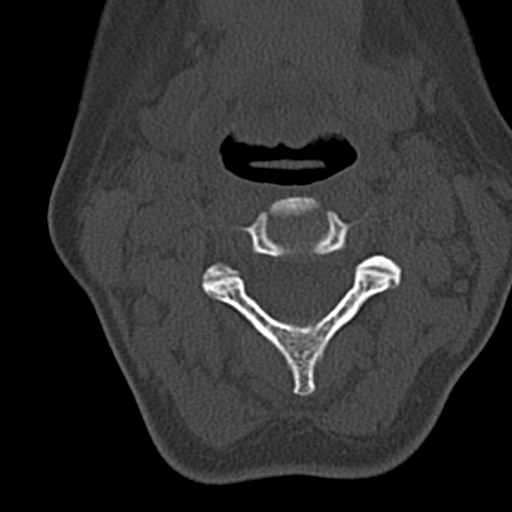
[im 78/100  bone]
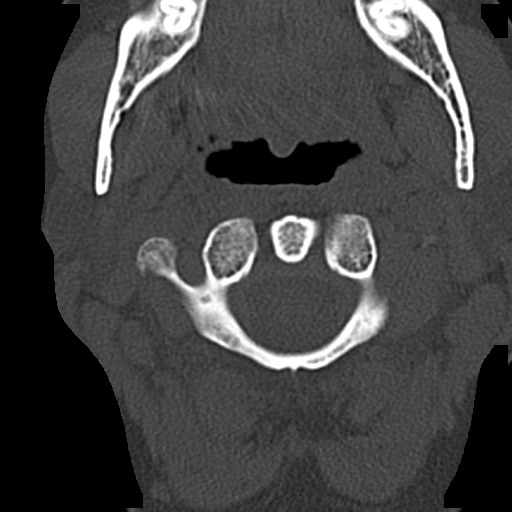
[im 89/100  bone]
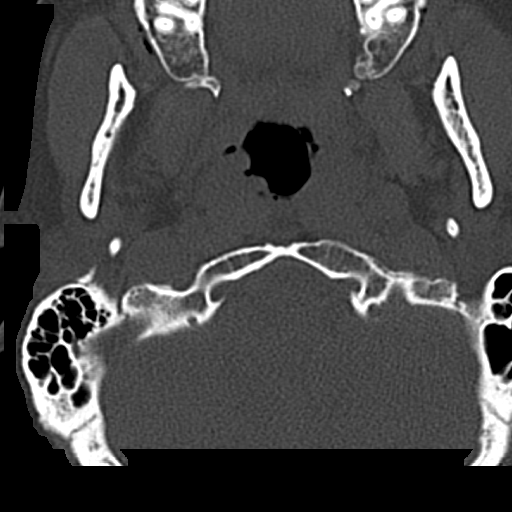

[Series 16: coronal soft · coronal · 0.29mm/px · 2 of 67 slices shown]
[im 23/67  bone]
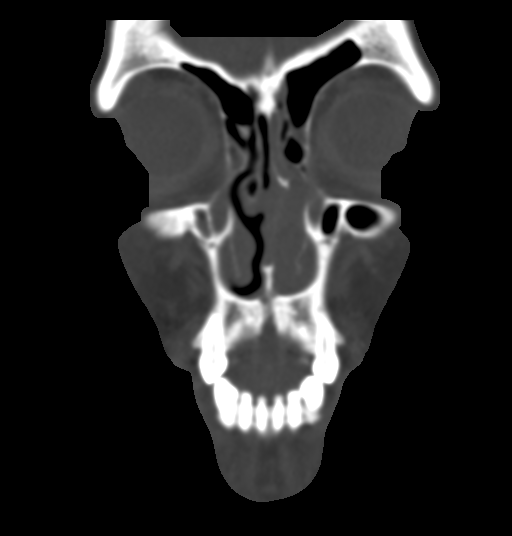
[im 45/67  bone]
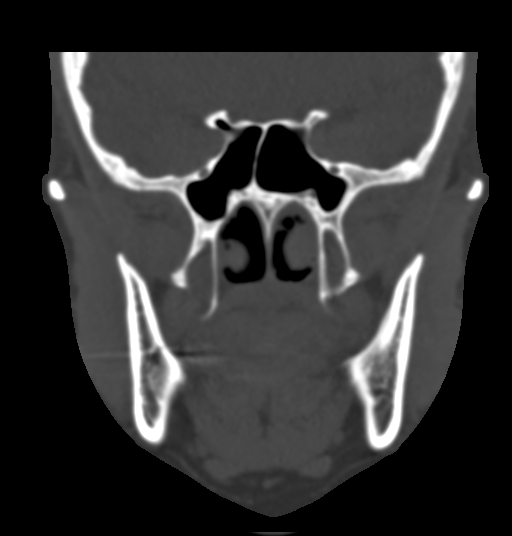

[Series 17: sagittal soft · sagittal · 0.31mm/px · 1 of 74 slices shown]
[im 37/74  bone]
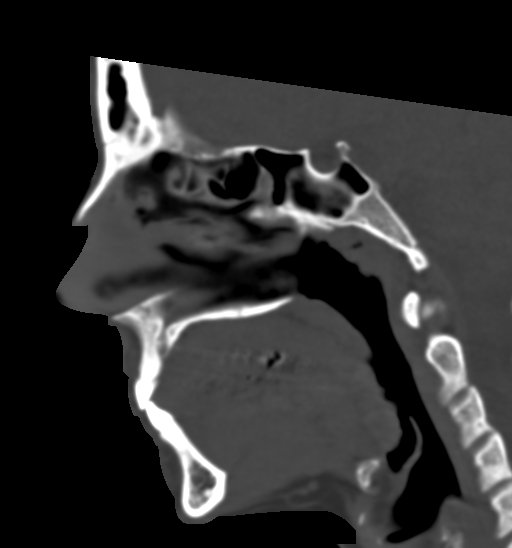

[16 of 47 positions shown; findings below may reference images not displayed]

FINDINGS: CT HEAD FINDINGS

Brain: No evidence of parenchymal hemorrhage or extra-axial fluid
collection. No mass lesion, mass effect, or midline shift. No CT
evidence of acute infarction. Cerebral volume is age appropriate. No
ventriculomegaly.

Vascular: No hyperdense vessel or unexpected calcification.

Skull: No evidence of calvarial fracture.

Sinuses/Orbits: Mild mucoperiosteal thickening in the bilateral
ethmoidal air cells. No fluid levels in the visualized paranasal
sinuses.

Other:  The mastoid air cells are unopacified.

CT MAXILLOFACIAL FINDINGS

Osseous: No fracture or mandibular dislocation. No destructive
process.

Orbits: Negative. No traumatic or inflammatory finding.

Sinuses: There is near complete opacification of the right maxillary
sinus with complex fluid density material. There is mucoperiosteal
thickening in the bilateral ethmoidal air cells and left maxillary
sinus.

Soft tissues: Negative.

CT CERVICAL SPINE FINDINGS

Alignment: Straightening of the cervical spine. No subluxation. Dens
is well positioned between the lateral masses of C1.

Skull base and vertebrae: No acute fracture. No primary bone lesion
or focal pathologic process.

Soft tissues and spinal canal: No prevertebral fluid or swelling. No
visible canal hematoma.

Disc levels: Preserved cervical disc heights without significant
spondylosis. No significant facet arthropathy or degenerative
foraminal stenosis.

Upper chest: Negative.

Other: Visualized mastoid air cells appear clear. No discrete
thyroid nodules. No pathologically enlarged cervical nodes.
IMPRESSION: 1. No evidence of acute intracranial abnormality. No evidence of
calvarial fracture.
2. No cervical spine fracture or subluxation.
3. No maxillofacial fracture.
4. Near complete right maxillary sinus opacification. Mucoperiosteal
thickening in the bilateral ethmoidal air cells and left maxillary
sinus, favor paranasal sinusitis, acuity uncertain.

## 2018-08-18 DIAGNOSIS — F411 Generalized anxiety disorder: Secondary | ICD-10-CM | POA: Diagnosis not present

## 2018-08-31 ENCOUNTER — Other Ambulatory Visit: Payer: Self-pay | Admitting: Internal Medicine

## 2018-08-31 DIAGNOSIS — F900 Attention-deficit hyperactivity disorder, predominantly inattentive type: Secondary | ICD-10-CM

## 2018-09-01 ENCOUNTER — Other Ambulatory Visit: Payer: Self-pay | Admitting: Internal Medicine

## 2018-09-01 DIAGNOSIS — F900 Attention-deficit hyperactivity disorder, predominantly inattentive type: Secondary | ICD-10-CM

## 2018-09-01 MED ORDER — AMPHETAMINE-DEXTROAMPHET ER 20 MG PO CP24: 20.0000 mg | ORAL_CAPSULE | ORAL | 0 refills | Status: DC

## 2018-09-01 NOTE — Telephone Encounter (Signed)
Inform did refill adderral x 1 month but for future needs appt every 3 months for controlled substances   tMS

## 2018-09-08 DIAGNOSIS — F411 Generalized anxiety disorder: Secondary | ICD-10-CM | POA: Diagnosis not present

## 2018-09-15 DIAGNOSIS — F411 Generalized anxiety disorder: Secondary | ICD-10-CM | POA: Diagnosis not present

## 2018-09-28 ENCOUNTER — Ambulatory Visit: Payer: BLUE CROSS/BLUE SHIELD | Admitting: Internal Medicine

## 2018-09-28 ENCOUNTER — Encounter: Payer: Self-pay | Admitting: Internal Medicine

## 2018-09-28 ENCOUNTER — Other Ambulatory Visit: Payer: Self-pay

## 2018-09-28 VITALS — BP 122/90 | HR 108 | Temp 98.1°F | Ht 64.0 in | Wt 184.6 lb

## 2018-09-28 DIAGNOSIS — F900 Attention-deficit hyperactivity disorder, predominantly inattentive type: Secondary | ICD-10-CM | POA: Diagnosis not present

## 2018-09-28 DIAGNOSIS — M25539 Pain in unspecified wrist: Secondary | ICD-10-CM | POA: Insufficient documentation

## 2018-09-28 DIAGNOSIS — M25531 Pain in right wrist: Secondary | ICD-10-CM

## 2018-09-28 DIAGNOSIS — I10 Essential (primary) hypertension: Secondary | ICD-10-CM | POA: Diagnosis not present

## 2018-09-28 HISTORY — DX: Pain in unspecified wrist: M25.539

## 2018-09-28 MED ORDER — AMPHETAMINE-DEXTROAMPHET ER 20 MG PO CP24: 20.0000 mg | ORAL_CAPSULE | ORAL | 0 refills | Status: DC

## 2018-09-28 NOTE — Progress Notes (Signed)
Chief Complaint  Patient presents with  . Follow-up   F/u  1. ADHD on adderral xr 20 mg qd doing well  2. 5 days ago slight wrong on flight and had right wrist pain and thumb and 1st 2 fingers went numb wearing wrist brace and helps some but twisting causes pain. Ibuprofen also helps pain but with movement pain is 4/10.  3. HTN sl elevated on losartan 25 and chlorthalidone 25   Review of Systems  Constitutional: Negative for weight loss.  HENT: Negative for hearing loss.   Eyes: Negative for blurred vision.  Respiratory: Negative for shortness of breath.   Cardiovascular: Negative for chest pain.  Musculoskeletal: Positive for joint pain.  Skin: Negative for rash.  Psychiatric/Behavioral: Negative for depression. The patient has insomnia.    Past Medical History:  Diagnosis Date  . Anxiety   . Cancer ([place])   . Depression   . Hodgkin's lymphoma ([place])    dx'ed 2005 tx'ed with chemo/radiation  . Hypertension   . Kidney stones   . Thyroid disease   . UTI (urinary tract infection)    Past Surgical History:  Procedure Laterality Date  . 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 PORT-A-CATH REMOVAL     2005   . TONSILLECTOMY     1987   Family History  Problem Relation Age of Onset  . Depression Mother        postpartum  . Hyperlipidemia Mother   . Cancer Father        lung in remission small cell cancer former smoker   . Depression Father   . Diabetes Father   . Hearing loss Father   . Hypertension Father   . Depression Sister   . ADD / ADHD Sister   . Kidney disease Brother   . ADD / ADHD Brother   . Diabetes Son        DM 1   . Cancer Maternal Grandmother        breast   . Early death Paternal Grandmother   . Stroke Paternal Grandmother   . Eating disorder Sister    Social History   Socioeconomic History  . Marital status: Married    Spouse name: Not on file  . Number of children: Not on file  . Years of education: Not  on file  . Highest education level: Not on file  Occupational History  . Not on file  Social Needs  . Financial resource strain: Not on file  . Food insecurity:    Worry: Not on file    Inability: Not on file  . Transportation needs:    Medical: Not on file    Non-medical: Not on file  Tobacco Use  . Smoking status: Never Smoker  . Smokeless tobacco: Never Used  Substance and Sexual Activity  . Alcohol use: Yes    Comment: occas.  . Drug use: No  . Sexual activity: Yes  Lifestyle  . Physical activity:    Days per week: Not on file    Minutes per session: Not on file  . Stress: Not on file  Relationships  . Social connections:    Talks on phone: Not on file    Gets together: Not on file    Attends religious service: Not on file    Active member of club or organization: Not on file    Attends meetings of clubs or organizations: Not on  file    Relationship status: Not on file  . Intimate partner violence:    Fear of current or ex partner: Not on file    Emotionally abused: Not on file    Physically abused: Not on file    Forced sexual activity: Not on file  Other Topics Concern  . 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 amphetamine-dextroamphetamine (ADDERALL XR) 20 MG 24 hr capsule Take 1 capsule (20 mg total) by mouth every morning.  . chlorthalidone (HYGROTON) 25 MG tablet Take 1 tablet (25 mg total) by mouth daily.  Marland Kitchen levothyroxine (SYNTHROID, LEVOTHROID) 100 MCG tablet Take 1 tablet (100 mcg total) by mouth daily. Take in morning on empty stomach one hour before eating  . losartan (COZAAR) 25 MG tablet Take 1 tablet (25 mg total) by mouth daily.  . Multiple Vitamin (MULTIVITAMIN WITH MINERALS) TABS tablet Take 1 tablet by mouth daily.  . sertraline (ZOLOFT) 50 MG tablet Take 1 tablet (50  mg total) by mouth daily.   No Known Allergies No results found for this or any previous visit (from the past 2160 hour(s)). Objective  Body mass index is 31.69 kg/m. Wt Readings from Last 3 Encounters:  09/28/18 184 lb 9.6 oz (83.7 kg)  05/13/18 177 lb 6.4 oz (80.5 kg)  02/23/18 177 lb 3.2 oz (80.4 kg)   Temp Readings from Last 3 Encounters:  09/28/18 98.1 F (36.7 C) (Oral)  05/13/18 98 F (36.7 C) (Oral)  02/23/18 98.4 F (36.9 C) (Tympanic)   BP Readings from Last 3 Encounters:  09/28/18 122/90  05/13/18 (!) 140/92  02/23/18 (!) 139/93   Pulse Readings from Last 3 Encounters:  09/28/18 (!) 108  05/13/18 83  02/23/18 83    Physical Exam  Constitutional: She is oriented to person, place, and time. Vital signs are normal. She appears well-developed and well-nourished. She is cooperative.  HENT:  Head: Normocephalic and atraumatic.  Mouth/Throat: Oropharynx is clear and moist and mucous membranes are normal.  Eyes: Pupils are equal, round, and reactive to light. Conjunctivae are normal.  Cardiovascular: Normal rate, regular rhythm and normal heart sounds.  Pulmonary/Chest: Effort normal and breath sounds normal.  Neurological: She is alert and oriented to person, place, and time. Gait normal.  Skin: Skin is warm, dry and intact.  Psychiatric: She has a normal mood and affect. Her speech is normal and behavior is normal. Judgment and thought content normal.  Nursing note and vitals reviewed.   Assessment   1. adhd  2. Right wrist pain w/ internal rotation c/w tendonitis +/- CTD  3. HTN  4. HM Plan   1. Cont adderral xr 20 mg qd  2. Refer to Dr. Amedeo Plenty  3. Cont meds consider increase losartan to 50 if diastolic elevated at f/u  4.  Had flu and Tdap  rec mmr vaccine  MRI/mammo 06/17/18 neg  Referred Dr. Leonides Schanz OB/GYN again today  Dermatology saw spring 2019   Need copy of psychological testing see last note   Provider: Dr. Olivia Mackie McLean-Scocuzza-[facility]

## 2018-09-28 NOTE — Patient Instructions (Addendum)
Results for Samantha Meyers, Samantha Meyers (MRN [id number]) as of 09/28/2018 08:45  Ref. Range 05/18/2018 08:22  MUMPS ABS, IGG Latest Ref Range: Immune >10.9 AU/mL 54.8  Rubella Latest Ref Range: Immune >0.99 index 3.13  RUBEOLA AB, IGG Latest Ref Range: Immune >29.9 AU/mL <25.0 (L)   Consider MMR vaccine x 2 doses at work  Dr. Amedeo Plenty hand ortho  Dr. Blima [facility] for pap   Carpal Tunnel Syndrome Carpal tunnel syndrome is a condition that causes pain in your hand and arm. The carpal tunnel is a narrow area located on the palm side of your wrist. Repeated wrist motion or certain diseases may cause swelling within the tunnel. This swelling pinches the main nerve in the wrist (median nerve). What are the causes? This condition may be caused by:  Repeated wrist motions.  Wrist injuries.  Arthritis.  A cyst or tumor in the carpal tunnel.  Fluid buildup during pregnancy.  Sometimes the cause of this condition is not known. What increases the risk? This condition is more likely to develop in:  People who have jobs that cause them to repeatedly move their wrists in the same motion, such as Art gallery manager.  Women.  People with certain conditions, such as: ? Diabetes. ? Obesity. ? An underactive thyroid (hypothyroidism). ? Kidney failure.  What are the signs or symptoms? Symptoms of this condition include:  A tingling feeling in your fingers, especially in your thumb, index, and middle fingers.  Tingling or numbness in your hand.  An aching feeling in your entire arm, especially when your wrist and elbow are bent for long periods of time.  Wrist pain that goes up your arm to your shoulder.  Pain that goes down into your palm or fingers.  A weak feeling in your hands. You may have trouble grabbing and holding items.  Your symptoms may feel worse during the night. How is this diagnosed? This condition is diagnosed with a medical history and physical exam. You may also  have tests, including:  An electromyogram (EMG). This test measures electrical signals sent by your nerves into the muscles.  X-rays.  How is this treated? Treatment for this condition includes:  Lifestyle changes. It is important to stop doing or modify the activity that caused your condition.  Physical or occupational therapy.  Medicines for pain and inflammation. This may include medicine that is injected into your wrist.  A wrist splint.  Surgery.  Follow these instructions at home: If you have a splint:  Wear it as told by your health care provider. Remove it only as told by your health care provider.  Loosen the splint if your fingers become numb and tingle, or if they turn cold and blue.  Keep the splint clean and dry. General instructions  Take over-the-counter and prescription medicines only as told by your health care provider.  Rest your wrist from any activity that may be causing your pain. If your condition is work related, talk to your employer about changes that can be made, such as getting a wrist pad to use while typing.  If directed, apply ice to the painful area: ? Put ice in a plastic bag. ? Place a towel between your skin and the bag. ? Leave the ice on for 20 minutes, 2-3 times per day.  Keep all follow-up visits as told by your health care provider. This is important.  Do any exercises as told by your health care provider, physical therapist, or occupational therapist. Contact a  health care provider if:  You have new symptoms.  Your pain is not controlled with medicines.  Your symptoms get worse. This information is not intended to replace advice given to you by your health care provider. Make sure you discuss any questions you have with your health care provider. Document Released: 10/10/2000 Document Revised: 02/21/2016 Document Reviewed: 02/28/2015 Elsevier Interactive Patient Education  2018 [employer].  Wrist Pain, Adult There are  many things that can cause wrist pain. Some common causes include:  An injury to the wrist area, such as a sprain, strain, or fracture.  Overuse of the joint.  A condition that causes increased pressure on a nerve in the wrist (carpal tunnel syndrome).  Wear and tear of the joints that occurs with aging (osteoarthritis).  A variety of other types of arthritis.  Sometimes, the cause of wrist pain is not known. Often, the pain goes away when you follow instructions from your health care provider for relieving pain at home, such as resting or icing the wrist. If your wrist pain continues, it is important to tell your health care provider. Follow these instructions at home:  Rest the wrist area for at least 48 hours or as long as told by your health care provider.  If a splint or elastic bandage has been applied, use it as told by your health care provider. ? Remove the splint or bandage only as told by your health care provider. ? Loosen the splint or bandage if your fingers tingle, become numb, or turn cold or blue.  If directed, apply ice to the injured area. ? If you have a removable splint or elastic bandage, remove it as told by your health care provider. ? Put ice in a plastic bag. ? Place a towel between your skin and the bag or between your splint or bandage and the bag. ? Leave the ice on for 20 minutes, 2-3 times a day.  Keep your arm raised (elevated) above the level of your heart while you are sitting or lying down.  Take over-the-counter and prescription medicines only as told by your health care provider.  Keep all follow-up visits as told by your health care provider. This is important. Contact a health care provider if:  You have a sudden sharp pain in the wrist, hand, or arm that is different or new.  The swelling or bruising on your wrist or hand gets worse.  Your skin becomes red, gets a rash, or has open sores.  Your pain does not get better or it gets  worse. Get help right away if:  You lose feeling in your fingers or hand.  Your fingers turn white, very red, or cold and blue.  You cannot move your fingers.  You have a fever or chills. This information is not intended to replace advice given to you by your health care provider. Make sure you discuss any questions you have with your health care provider. Document Released: 07/23/2005 Document Revised: 05/08/2016 Document Reviewed: 05/01/2016 Elsevier Interactive Patient Education  [employer].

## 2018-09-28 NOTE — Progress Notes (Signed)
Pre visit review using our clinic review tool, if applicable. No additional management support is needed unless otherwise documented below in the visit note. 

## 2018-10-06 DIAGNOSIS — F411 Generalized anxiety disorder: Secondary | ICD-10-CM | POA: Diagnosis not present

## 2018-10-13 DIAGNOSIS — F411 Generalized anxiety disorder: Secondary | ICD-10-CM | POA: Diagnosis not present

## 2018-11-03 DIAGNOSIS — F411 Generalized anxiety disorder: Secondary | ICD-10-CM | POA: Diagnosis not present

## 2018-11-17 ENCOUNTER — Encounter: Payer: Self-pay | Admitting: Internal Medicine

## 2018-11-18 DIAGNOSIS — F411 Generalized anxiety disorder: Secondary | ICD-10-CM | POA: Diagnosis not present

## 2018-11-24 DIAGNOSIS — F411 Generalized anxiety disorder: Secondary | ICD-10-CM | POA: Diagnosis not present

## 2018-12-22 ENCOUNTER — Other Ambulatory Visit: Payer: Self-pay | Admitting: Internal Medicine

## 2018-12-22 DIAGNOSIS — F900 Attention-deficit hyperactivity disorder, predominantly inattentive type: Secondary | ICD-10-CM

## 2018-12-24 ENCOUNTER — Other Ambulatory Visit: Payer: Self-pay | Admitting: Internal Medicine

## 2018-12-24 DIAGNOSIS — F900 Attention-deficit hyperactivity disorder, predominantly inattentive type: Secondary | ICD-10-CM

## 2018-12-24 MED ORDER — AMPHETAMINE-DEXTROAMPHET ER 20 MG PO CP24: 20.0000 mg | ORAL_CAPSULE | ORAL | 0 refills | Status: DC

## 2018-12-24 NOTE — Telephone Encounter (Signed)
Keep f/u for 12/28/2018 f/u on controlled meds every 3 months  [place]

## 2018-12-28 ENCOUNTER — Encounter: Payer: Self-pay | Admitting: Internal Medicine

## 2018-12-28 ENCOUNTER — Ambulatory Visit: Payer: BLUE CROSS/BLUE SHIELD | Admitting: Internal Medicine

## 2018-12-28 VITALS — BP 106/62 | HR 88 | Temp 98.2°F | Ht 64.0 in | Wt 184.0 lb

## 2018-12-28 DIAGNOSIS — I10 Essential (primary) hypertension: Secondary | ICD-10-CM | POA: Diagnosis not present

## 2018-12-28 DIAGNOSIS — Z0184 Encounter for antibody response examination: Secondary | ICD-10-CM

## 2018-12-28 DIAGNOSIS — F900 Attention-deficit hyperactivity disorder, predominantly inattentive type: Secondary | ICD-10-CM | POA: Diagnosis not present

## 2018-12-28 DIAGNOSIS — M25531 Pain in right wrist: Secondary | ICD-10-CM

## 2018-12-28 DIAGNOSIS — Z1159 Encounter for screening for other viral diseases: Secondary | ICD-10-CM

## 2018-12-28 DIAGNOSIS — E039 Hypothyroidism, unspecified: Secondary | ICD-10-CM | POA: Diagnosis not present

## 2018-12-28 MED ORDER — AMPHETAMINE-DEXTROAMPHET ER 20 MG PO CP24: 20.0000 mg | ORAL_CAPSULE | ORAL | 0 refills | Status: DC

## 2018-12-28 NOTE — Patient Instructions (Addendum)
Results for Samantha Meyers, Samantha Meyers (MRN [id number]) as of 12/28/2018 09:58  Ref. Range 05/18/2018 08:22  MUMPS ABS, IGG Latest Ref Range: Immune >10.9 AU/mL 54.8  Rubella Latest Ref Range: Immune >0.99 index 3.13  RUBEOLA AB, IGG Latest Ref Range: Immune >29.9 AU/mL <25.0 (L)    Pap smear Dr. Augustin Schooling [phone]  If blood pressure low <90/<60 cut losartan in 1/2 pill   Blood pressure cuff and log your blood pressure

## 2018-12-28 NOTE — Progress Notes (Signed)
Chief Complaint  Patient presents with  . Follow-up   F/u  1. HTN low normal today only on losartan 25 mg qd  2. ADHD needs refill of adderral picked up 12/24/18 #31  3. Right wrist pain appt Dr. Amedeo Plenty upcoming    Review of Systems  Constitutional: Negative for weight loss.  HENT: Negative for hearing loss.   Eyes: Negative for blurred vision.  Respiratory: Negative for shortness of breath.   Cardiovascular: Negative for chest pain.  Gastrointestinal: Negative for abdominal pain.  Musculoskeletal: Positive for joint pain.  Skin: Negative for rash.  Neurological: Negative for headaches.  Psychiatric/Behavioral: Negative for depression. The patient is not nervous/anxious.    Past Medical History:  Diagnosis Date  . Anxiety   . Cancer ([place])   . Depression   . Hodgkin's lymphoma ([place])    dx'ed 2005 tx'ed with chemo/radiation  . Hypertension   . Kidney stones   . Thyroid disease   . UTI (urinary tract infection)    Past Surgical History:  Procedure Laterality Date  . 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 PORT-A-CATH REMOVAL     2005   . TONSILLECTOMY     1987   Family History  Problem Relation Age of Onset  . Depression Mother        postpartum  . Hyperlipidemia Mother   . Cancer Father        lung in remission small cell cancer former smoker   . Depression Father   . Diabetes Father   . Hearing loss Father   . Hypertension Father   . Depression Sister   . ADD / ADHD Sister   . Kidney disease Brother   . ADD / ADHD Brother   . Diabetes Son        DM 1   . Cancer Maternal Grandmother        breast   . Early death Paternal Grandmother   . Stroke Paternal Grandmother   . Eating disorder Sister    Social History   Socioeconomic History  . Marital status: Married    Spouse name: Not on file  . Number of children: Not on file  . Years of education: Not on file  . Highest education level: Not on file  Occupational  History  . Not on file  Social Needs  . Financial resource strain: Not on file  . Food insecurity:    Worry: Not on file    Inability: Not on file  . Transportation needs:    Medical: Not on file    Non-medical: Not on file  Tobacco Use  . Smoking status: Never Smoker  . Smokeless tobacco: Never Used  Substance and Sexual Activity  . Alcohol use: Yes    Comment: occas.  . Drug use: No  . Sexual activity: Yes  Lifestyle  . Physical activity:    Days per week: Not on file    Minutes per session: Not on file  . Stress: Not on file  Relationships  . Social connections:    Talks on phone: Not on file    Gets together: Not on file    Attends religious service: Not on file    Active member of club or organization: Not on file    Attends meetings of clubs or organizations: Not on file    Relationship status: Not on file  . Intimate partner violence:    Fear of  current or ex partner: Not on file    Emotionally abused: Not on file    Physically abused: Not on file    Forced sexual activity: Not on file  Other Topics Concern  . 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   No Known Allergies No results found for this or any previous visit (from the past 2160 hour(s)). Objective  Body mass index is 31.58 kg/m. Wt Readings from Last 3 Encounters:  12/28/18 184 lb (83.5 kg)  09/28/18 184 lb 9.6 oz (83.7 kg)  05/13/18 177 lb 6.4 oz (80.5 kg)   Temp Readings from Last 3 Encounters:  12/28/18 98.2 F (36.8 C) (Oral)  09/28/18 98.1 F (36.7 C) (Oral)  05/13/18 98 F (36.7 C) (Oral)   BP Readings from Last 3 Encounters:  12/28/18 106/62  09/28/18 122/90  05/13/18 (!) 140/92   Pulse Readings from Last 3  Encounters:  12/28/18 88  09/28/18 (!) 108  05/13/18 83    Physical Exam Vitals signs and nursing note reviewed.  Constitutional:      Appearance: Normal appearance. She is well-developed and well-groomed.  HENT:     Head: Normocephalic and atraumatic.     Nose: Nose normal.     Mouth/Throat:     Mouth: Mucous membranes are moist.     Pharynx: Oropharynx is clear.  Eyes:     Conjunctiva/sclera: Conjunctivae normal.     Pupils: Pupils are equal, round, and reactive to light.  Cardiovascular:     Rate and Rhythm: Normal rate and regular rhythm.     Heart sounds: Normal heart sounds.  Pulmonary:     Effort: Pulmonary effort is normal.     Breath sounds: Normal breath sounds.  Skin:    General: Skin is warm and dry.  Neurological:     General: No focal deficit present.     Mental Status: She is alert and oriented to person, place, and time.     Gait: Gait normal.  Psychiatric:        Attention and Perception: Attention and perception normal.        Mood and Affect: Mood and affect normal.        Speech: Speech normal.        Behavior: Behavior normal. Behavior is cooperative.        Thought Content: Thought content normal.        Cognition and Memory: Cognition and memory normal.        Judgment: Judgment normal.     Assessment   1. HTN with low normal reading today 2. adhd  3. HM Plan   1. She is not taking chlorthalidone 25 mg qam only losartan 25 mg qd  Log BP and buy cuff  2. Refilled adderral will continue to refill until after f/u 05/2019  We have records of psych testing  3.  Had flu and Tdap  rec mmr vaccine had 1/2  MRI/mammo 06/17/18 neg ordered by [facility] onc to f/u 04/2019 for h/o Hodgkins Call [facility] Dr. Augustin Schooling OB/GYN for pap still has not schedule  Dermatology saw spring/fall 2019  in due 2020  Hep B immune  Check  MMR again   F/u Dr. Amedeo Plenty upcoming right wrist pain c/w CTS Fasting labs 05/19/19  Provider: Dr. Olivia Mackie McLean-Scocuzza-[facility]  Medicine

## 2018-12-28 NOTE — Progress Notes (Signed)
Pre visit review using our clinic review tool, if applicable. No additional management support is needed unless otherwise documented below in the visit note. 

## 2019-01-05 DIAGNOSIS — M25531 Pain in right wrist: Secondary | ICD-10-CM | POA: Diagnosis not present

## 2019-02-10 DIAGNOSIS — F411 Generalized anxiety disorder: Secondary | ICD-10-CM | POA: Diagnosis not present

## 2019-04-12 DIAGNOSIS — F411 Generalized anxiety disorder: Secondary | ICD-10-CM | POA: Diagnosis not present

## 2019-04-18 DIAGNOSIS — T66XXXS Radiation sickness, unspecified, sequela: Secondary | ICD-10-CM | POA: Insufficient documentation

## 2019-05-05 ENCOUNTER — Ambulatory Visit (INDEPENDENT_AMBULATORY_CARE_PROVIDER_SITE_OTHER): Payer: BC Managed Care – PPO | Admitting: Internal Medicine

## 2019-05-05 ENCOUNTER — Other Ambulatory Visit: Payer: Self-pay

## 2019-05-05 ENCOUNTER — Ambulatory Visit: Payer: Self-pay | Admitting: Internal Medicine

## 2019-05-05 DIAGNOSIS — J309 Allergic rhinitis, unspecified: Secondary | ICD-10-CM

## 2019-05-05 DIAGNOSIS — J9801 Acute bronchospasm: Secondary | ICD-10-CM

## 2019-05-05 MED ORDER — ALBUTEROL SULFATE HFA 108 (90 BASE) MCG/ACT IN AERS: 1.0000 | INHALATION_SPRAY | RESPIRATORY_TRACT | 2 refills | Status: DC | PRN

## 2019-05-05 MED ORDER — LORATADINE 10 MG PO TABS: 10.0000 mg | ORAL_TABLET | Freq: Every day | ORAL | 3 refills | Status: DC | PRN

## 2019-05-05 NOTE — Telephone Encounter (Signed)
Pt reports dry cough, onset Tuesday, severe.  Denies fever, no other symptoms. Does states she is a runner and can not jog as far as usual, "Out of breath."  Requesting Covid  Testing as "My son is diabetic and I am high risk." Risk score '2'.  Call transferred to practice, [facility] for consideration of appt. Pts email and ph# verified CB# [phone]  Reason for Disposition . [1] Continuous (nonstop) coughing interferes with work or school AND [2] no improvement using cough treatment per protocol  Answer Assessment - Initial Assessment Questions 1. ONSET: "When did the cough begin?"      Tuesday 2. SEVERITY: "How bad is the cough today?"      severe 3. RESPIRATORY DISTRESS: "Describe your breathing."       SOB with "Jogging" only, CAn't run as far     4. FEVER: "Do you have a fever?" If so, ask: "What is your temperature, how was it measured, and when did it start?" no    5. HEMOPTYSIS: "Are you coughing up any blood?" If so ask: "How much?" (flecks, streaks, tablespoons, etc.)     no 6. TREATMENT: "What have you done so far to treat the cough?" (e.g., meds, fluids, humidifier)    Cough lozenges. 7. CARDIAC HISTORY: "Do you have any history of heart disease?" (e.g., heart attack, congestive heart failure)      no 8. LUNG HISTORY: "Do you have any history of lung disease?"  (e.g., pulmonary embolus, asthma, emphysema)     no 9. PE RISK FACTORS: "Do you have a history of blood clots?" (or: recent major surgery, recent prolonged travel, bedridden)     no 10. OTHER SYMPTOMS: "Do you have any other symptoms? (e.g., runny nose, wheezing, chest pain)       no 11. PREGNANCY: "Is there any chance you are pregnant?" "When was your last menstrual period?"      no 12. TRAVEL: "Have you traveled out of the country in the last month?" (e.g., travel history, exposures)       no  Protocols used: COUGH - ACUTE NON-PRODUCTIVE-A-AH

## 2019-05-05 NOTE — Progress Notes (Signed)
Virtual Visit via Video Note  I connected with Samantha Meyers   on 05/05/19 at  4:12 PM EDT by a video enabled telemedicine application and verified that I am speaking with the correct person using two identifiers.  Location patient: home Location provider:work  Persons participating in the virtual visit: patient, provider  I discussed the limitations of evaluation and management by telemedicine and the availability of in person appointments. The patient expressed understanding and agreed to proceed.   HPI: 1. Cough since 05/03/19 dry cough with wheezing and sob with exetion. Cough is worse at night with lying down as well as wheezing. She feels like water log in a pool and like post nasal drip. She denies congestion/GERD. She is allergic to pollen and when 38 y.o she used and inhaler but denies h/o asthma. She has not tried anything but cough drops or been exposed to COVID 19 that she knows but going to the pool with her 3 kids and 1 of her 3 kids has DM 1. She denies fever     ROS: See pertinent positives and negatives per HPI.  Past Medical History:  Diagnosis Date  . Anxiety   . Cancer ([place])   . Depression   . Hodgkin's lymphoma ([place])    dx'ed 2005 tx'ed with chemo/radiation  . Hypertension   . Kidney stones   . Thyroid disease   . UTI (urinary tract infection)     Past Surgical History:  Procedure Laterality Date  . 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 PORT-A-CATH REMOVAL     2005   . TONSILLECTOMY     1987    Family History  Problem Relation Age of Onset  . Depression Mother        postpartum  . Hyperlipidemia Mother   . Cancer Father        lung in remission small cell cancer former smoker   . Depression Father   . Diabetes Father   . Hearing loss Father   . Hypertension Father   . Depression Sister   . ADD / ADHD Sister   . Kidney disease Brother   . ADD / ADHD Brother   . Diabetes Son        DM 1   . Cancer  Maternal Grandmother        breast   . Early death Paternal Grandmother   . Stroke Paternal Grandmother   . 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  amphetamine-dextroamphetamine (ADDERALL XR) 20 MG 24 hr capsule, Take 1 capsule (20 mg total) by mouth every morning. Fill 30 days after 12/24/18 Rx picked up, Disp: 90 capsule, Rfl: 0 .  levothyroxine (SYNTHROID, LEVOTHROID) 100 MCG tablet, Take 1 tablet (100 mcg total) by mouth daily. Take in morning on empty stomach one hour before eating, Disp: 90 tablet, Rfl: 3 .  losartan (COZAAR) 25 MG tablet, Take 1 tablet (25 mg total) by mouth daily., Disp: 90 tablet, Rfl: 3 .  Multiple Vitamin (MULTIVITAMIN WITH MINERALS) TABS tablet, Take 1 tablet by mouth daily., Disp: , Rfl:  .  sertraline (ZOLOFT) 50 MG tablet, Take 1 tablet (50 mg total) by mouth daily., Disp: 90 tablet, Rfl: 3 .  albuterol (  PROAIR HFA) 108 (90 Base) MCG/ACT inhaler, Inhale 1-2 puffs into the lungs every 4 (four) hours as needed for wheezing or shortness of breath., Disp: 18 g, Rfl: 2 .  loratadine (CLARITIN) 10 MG tablet, Take 1 tablet (10 mg total) by mouth daily as needed for allergies., Disp: 90 tablet, Rfl: 3  EXAM:  VITALS per patient if applicable:  GENERAL: alert, oriented, appears well and in no acute distress  HEENT: atraumatic, conjunttiva clear, no obvious abnormalities on inspection of external nose and ears  NECK: normal movements of the head and neck  LUNGS: on inspection no signs of respiratory distress, breathing rate appears normal, no obvious gross SOB, gasping or wheezing  CV: no obvious cyanosis  MS: moves all visible extremities without noticeable abnormality  PSYCH/NEURO: pleasant and cooperative, no obvious depression or anxiety, speech and thought processing grossly  intact  ASSESSMENT AND PLAN:  Discussed the following assessment and plan:  Allergic rhinitis, unspecified seasonality, unspecified trigger - Plan: loratadine (CLARITIN) 10 MG tablet, albuterol (PROAIR HFA) 108 (90 Base) MCG/ACT inhaler  Bronchospasm - Plan: albuterol (PROAIR HFA) 108 (90 Base) MCG/ACT inhaler -if sob on exertion and wheezing is not better by next week consider CXR and COVID 19 testing disc with pt today  rec prn Robitussin DM for cough prn, cough drops and warm tea with honey and lemon     I discussed the assessment and treatment plan with the patient. The patient was provided an opportunity to ask questions and all were answered. The patient agreed with the plan and demonstrated an understanding of the instructions.   The patient was advised to call back or seek an in-person evaluation if the symptoms worsen or if the condition fails to improve as anticipated.  Time spent 15 minutes  Delorise Jackson, MD

## 2019-05-05 NOTE — Progress Notes (Signed)
Pre visit review using our clinic review tool, if applicable. No additional management support is needed unless otherwise documented below in the visit note. 

## 2019-05-05 NOTE — Telephone Encounter (Signed)
Patient has a virtual visit scheduled today with PCP @ 4:00 pm.

## 2019-05-09 ENCOUNTER — Encounter: Payer: Self-pay | Admitting: Internal Medicine

## 2019-05-10 ENCOUNTER — Other Ambulatory Visit: Payer: Self-pay | Admitting: Internal Medicine

## 2019-05-10 DIAGNOSIS — F900 Attention-deficit hyperactivity disorder, predominantly inattentive type: Secondary | ICD-10-CM

## 2019-05-10 MED ORDER — AMPHETAMINE-DEXTROAMPHET ER 20 MG PO CP24: 20.0000 mg | ORAL_CAPSULE | ORAL | 0 refills | Status: DC

## 2019-05-11 DIAGNOSIS — F411 Generalized anxiety disorder: Secondary | ICD-10-CM | POA: Diagnosis not present

## 2019-05-16 ENCOUNTER — Telehealth: Payer: Self-pay | Admitting: *Deleted

## 2019-05-16 ENCOUNTER — Other Ambulatory Visit: Payer: Self-pay

## 2019-05-16 DIAGNOSIS — Z20822 Contact with and (suspected) exposure to covid-19: Secondary | ICD-10-CM

## 2019-05-16 DIAGNOSIS — R6889 Other general symptoms and signs: Secondary | ICD-10-CM | POA: Diagnosis not present

## 2019-05-16 NOTE — Telephone Encounter (Signed)
COVID 19 testing ordered today quarantine to house until results back  Currently we dont have treatment for COVID 19 other than supportive care  If cough otc cough meds I.e Robitussin DM or Mucinex DM  As need Tylenol for fever  Warm tea with honey and lemon  Vitamin C 500 mg daily  She can also try Elderberry   [place]

## 2019-05-16 NOTE — Telephone Encounter (Signed)
Copied from [place] [id number]. Topic: General - Other >> May 16, 2019 11:30 AM Leward Quan A wrote: Reason for CRM: Patient called to say that she took medications prescribed by Dr Olivia Mackie and the symptoms went away temporarily but came back, also there are two other people in her family household? With these similar symptoms so she is going to go and get COVID tested today but ask for a call back from Dr Olivia Mackie if there is anything she should be doing.

## 2019-05-18 NOTE — Telephone Encounter (Signed)
Pt is scheduled for a lab appt on 05/19/19. Please reschedule her one week out. And notify her to move it out 30 days if it comes back positive.

## 2019-05-18 NOTE — Telephone Encounter (Signed)
Unable to leave message for patient to return call back, phone kept ringing. PEC may give information.  

## 2019-05-19 ENCOUNTER — Other Ambulatory Visit: Payer: BLUE CROSS/BLUE SHIELD

## 2019-05-20 ENCOUNTER — Telehealth: Payer: Self-pay | Admitting: Internal Medicine

## 2019-05-20 LAB — NOVEL CORONAVIRUS, NAA: SARS-CoV-2, NAA: NOT DETECTED

## 2019-05-20 NOTE — Telephone Encounter (Signed)
I rec take Robitussin DM or Mucinex DM for cough  Warm tea with honey and lemon  Cough drops   Her test results were negative though this test may not be 100% accurate  She could have a common cold, allergies causing cough  If she wants to be retested [facility] [address] to schedule without an order from me 9 am to 4 pm daily   Given symptoms I am not concerned with COVID 19 at this time  [place]

## 2019-05-20 NOTE — Telephone Encounter (Signed)
Patient is concerned that her test is negative- her children are having cough symptoms and she is suspicious. Patient is still having her symptoms- they had improved with allergy medication- 5-6 days and then got worse again. Patient reports that is when her children started having symptoms. ( They are active and have not been tested) Patient wants to to know if she should get retested- or does PCP feel safe scheduling her. Patient does report she has inhaler and she is not in distress- she does feel she is not getting full air in at times - but her O2 sat has been above 95.97-100. She will do whatever PCP feels most comfortable doing.

## 2019-05-20 NOTE — Telephone Encounter (Signed)
Copied from [place] [id number]. Topic: Quick Communication - Lab Results (Clinic Use ONLY) >> May 20, 2019  8:38 AM Babs Bertin, CMA wrote: Called patient to inform them of [id number] lab results. When patient returns call, triage nurse may disclose results.   Please call pt back regarding labs [phone]

## 2019-05-25 DIAGNOSIS — F411 Generalized anxiety disorder: Secondary | ICD-10-CM | POA: Diagnosis not present

## 2019-05-26 ENCOUNTER — Encounter: Payer: Self-pay | Admitting: Internal Medicine

## 2019-05-26 ENCOUNTER — Other Ambulatory Visit: Payer: Self-pay

## 2019-05-26 ENCOUNTER — Ambulatory Visit (INDEPENDENT_AMBULATORY_CARE_PROVIDER_SITE_OTHER): Payer: BC Managed Care – PPO | Admitting: Internal Medicine

## 2019-05-26 VITALS — Ht 64.0 in | Wt 184.5 lb

## 2019-05-26 DIAGNOSIS — F900 Attention-deficit hyperactivity disorder, predominantly inattentive type: Secondary | ICD-10-CM | POA: Diagnosis not present

## 2019-05-26 DIAGNOSIS — E039 Hypothyroidism, unspecified: Secondary | ICD-10-CM | POA: Diagnosis not present

## 2019-05-26 DIAGNOSIS — Z8571 Personal history of Hodgkin lymphoma: Secondary | ICD-10-CM

## 2019-05-26 DIAGNOSIS — F419 Anxiety disorder, unspecified: Secondary | ICD-10-CM

## 2019-05-26 DIAGNOSIS — R05 Cough: Secondary | ICD-10-CM

## 2019-05-26 DIAGNOSIS — R059 Cough, unspecified: Secondary | ICD-10-CM

## 2019-05-26 DIAGNOSIS — F329 Major depressive disorder, single episode, unspecified: Secondary | ICD-10-CM

## 2019-05-26 DIAGNOSIS — I10 Essential (primary) hypertension: Secondary | ICD-10-CM

## 2019-05-26 DIAGNOSIS — F32A Depression, unspecified: Secondary | ICD-10-CM

## 2019-05-26 MED ORDER — SERTRALINE HCL 50 MG PO TABS: 50.0000 mg | ORAL_TABLET | Freq: Every day | ORAL | 3 refills | Status: DC

## 2019-05-26 MED ORDER — LEVOTHYROXINE SODIUM 100 MCG PO TABS: 100.0000 ug | ORAL_TABLET | Freq: Every day | ORAL | 3 refills | Status: DC

## 2019-05-26 MED ORDER — LOSARTAN POTASSIUM 25 MG PO TABS: 25.0000 mg | ORAL_TABLET | Freq: Every day | ORAL | 3 refills | Status: DC

## 2019-05-26 MED ORDER — AMPHETAMINE-DEXTROAMPHET ER 20 MG PO CP24: 20.0000 mg | ORAL_CAPSULE | ORAL | 0 refills | Status: DC

## 2019-05-26 NOTE — Progress Notes (Signed)
Virtual Visit via Video Note  I connected with Samantha Meyers   on 05/26/19 at  8:30 AM EDT by a video enabled telemedicine application and verified that I am speaking with the correct person using two identifiers.  Location patient: home Location provider:work  Persons participating in the virtual visit: patient, provider  I discussed the limitations of evaluation and management by telemedicine and the availability of in person appointments. The patient expressed understanding and agreed to proceed.   HPI: 1. ADHD needs refills of medications on adderall xl 20 mg qd and doing well  2. HTN BP controlled 120/80 on losartan 25 mg qd  3. Still has cough but better and wheezing better using prn Albuterol before exercise and at night. She had allergy shots as a kid, stopped Claritin prn due to making feel sleepy, COVID 19 tests negative  She has to do a mail order covid 19 test for work at [employer]. Anxiety/depression on zoloft 50 mg qd refill today doing well   ROS: See pertinent positives and negatives per HPI.  Past Medical History:  Diagnosis Date  . Anxiety   . Cancer ([facility])   . Depression   . Hodgkin's lymphoma ([facility])    dx'ed 2005 tx'ed with chemo/radiation  . Hypertension   . Kidney stones   . Thyroid disease   . UTI (urinary tract infection)     Past Surgical History:  Procedure Laterality Date  . 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 PORT-A-CATH REMOVAL     2005   . TONSILLECTOMY     1987    Family History  Problem Relation Age of Onset  . Depression Mother        postpartum  . Hyperlipidemia Mother   . Cancer Father        lung in remission small cell cancer former smoker   . Depression Father   . Diabetes Father   . Hearing loss Father   . Hypertension Father   . Depression Sister   . ADD / ADHD Sister   . Kidney disease Brother   . ADD / ADHD Brother   . Diabetes Son        DM 1   . Cancer Maternal Grandmother         breast   . Early death Paternal Grandmother   . Stroke Paternal Grandmother   . 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  albuterol (PROAIR HFA) 108 (90 Base) MCG/ACT inhaler, Inhale 1-2 puffs into the lungs every 4 (four) hours as needed for wheezing or shortness of breath., Disp: 18 g, Rfl: 2 .  amphetamine-dextroamphetamine (ADDERALL XR) 20 MG 24 hr capsule, Take 1 capsule (20 mg total) by mouth every morning. Fill 30 days due 08/2019, Disp: 30 capsule, Rfl: 0 .  levothyroxine (SYNTHROID) 100 MCG tablet, Take 1 tablet (100 mcg total) by mouth daily. Take in morning on empty stomach one hour before eating, Disp: 90 tablet, Rfl: 3 .  loratadine (CLARITIN) 10 MG tablet, Take 1 tablet (10 mg total) by mouth daily as needed for allergies., Disp: 90 tablet, Rfl: 3 .  losartan (COZAAR) 25 MG tablet, Take 1 tablet (25 mg total) by mouth daily., Disp: 90 tablet,   Rfl: 3 .  Multiple Vitamin (MULTIVITAMIN WITH MINERALS) TABS tablet, Take 1 tablet by mouth daily., Disp: , Rfl:  .  sertraline (ZOLOFT) 50 MG tablet, Take 1 tablet (50 mg total) by mouth daily., Disp: 90 tablet, Rfl: 3  EXAM:  VITALS per patient if applicable:  GENERAL: alert, oriented, appears well and in no acute distress  HEENT: atraumatic, conjunttiva clear, no obvious abnormalities on inspection of external nose and ears  NECK: normal movements of the head and neck  LUNGS: on inspection no signs of respiratory distress, breathing rate appears normal, no obvious gross SOB, gasping or wheezing  CV: no obvious cyanosis  MS: moves all visible extremities without noticeable abnormality  PSYCH/NEURO: pleasant and cooperative, no obvious depression or anxiety, speech and thought processing grossly intact  ASSESSMENT AND PLAN:  Discussed the  following assessment and plan:  Anxiety and depression - Plan: refilled zoloft 50 mg qd   Hypertension, unspecified type - Plan: losartan (COZAAR) 25 MG tablet qd Bp controlled   Hypothyroidism, unspecified type - Plan: levothyroxine (SYNTHROID) 100 MCG tablet Labs pending   ADHD (attention deficit hyperactivity disorder), inattentive type - Plan: amphetamine-dextroamphetamine (ADDERALL XR) 20 MG 24 hr capsule filled until 08/2019 will need f/u by12/2020   H/o Hodgkins  -appt [facility] H/o 05/2019 for eKG and f/u   Cough improved  likely allergic ? Allergic asthma with h/o allergy shots as kid less likely GERD w/o sx's today  If continues consider CXR in future and allergist referral  Declines singulair for now   HM-physical at f/u   Had flu and Tdap rec mmr vaccinehad 1/2  MRI/mammo8/22/19 negordered by [facility] onc to f/u 05/2019 for h/o Hodgkins and will have EKG, mammo and breast MRI  Call [facility] for pap still has not scheduled encouraged patient to call  Dermatology saw spring/fall 2019 in due 2020  Hep B immune  Check MMR again   F/u Dr. Gramig upcoming right wrist pain c/w CTS Fasting labs pending   I discussed the assessment and treatment plan with the patient. The patient was provided an opportunity to ask questions and all were answered. The patient agreed with the plan and demonstrated an understanding of the instructions.   The patient was advised to call back or seek an in-person evaluation if the symptoms worsen or if the condition fails to improve as anticipated.  Time spent 15 minutes  Tracy N McLean-Scocuzza, MD  

## 2019-06-03 ENCOUNTER — Other Ambulatory Visit: Payer: BC Managed Care – PPO

## 2019-06-06 ENCOUNTER — Telehealth: Payer: Self-pay | Admitting: Internal Medicine

## 2019-06-06 NOTE — Telephone Encounter (Signed)
Copied from [place] [id number]. Topic: Quick Communication - Rx Refill/Question >> Jun 06, 2019  8:34 AM Erick Blinks wrote: Medication: [facility] that [facility] needs PCP to resend script for amphetamine-dextroamphetamine (ADDERALL XR) 20 MG 24 hr capsule    Has the patient contacted their pharmacy? Yes  (Agent: If no, request that the patient contact the pharmacy for the refill.) (Agent: If yes, when and what did the pharmacy advise?)  Preferred Pharmacy (with phone number or street name): [id number] [place] [address] [place] [address] Phone: [phone] Fax: [phone]    Agent: Please be advised that RX refills may take up to 3 business days. We ask that you follow-up with your pharmacy.

## 2019-06-06 NOTE — Telephone Encounter (Signed)
Pt picks up adderral on the 15th of every month  She has 4 months on file so not due 11/11/2019     [place]

## 2019-06-06 NOTE — Telephone Encounter (Signed)
Pt should not be due for a refill yet.  Last Refill:  05/26/19 for 30 tablets  Last OV:  05/26/19

## 2019-06-30 ENCOUNTER — Telehealth: Payer: Self-pay | Admitting: *Deleted

## 2019-06-30 NOTE — Telephone Encounter (Signed)
Please place future orders for lab appt.  

## 2019-07-01 ENCOUNTER — Other Ambulatory Visit: Payer: Self-pay | Admitting: Internal Medicine

## 2019-07-01 DIAGNOSIS — Z1389 Encounter for screening for other disorder: Secondary | ICD-10-CM

## 2019-07-01 DIAGNOSIS — E039 Hypothyroidism, unspecified: Secondary | ICD-10-CM

## 2019-07-01 DIAGNOSIS — Z1159 Encounter for screening for other viral diseases: Secondary | ICD-10-CM

## 2019-07-01 DIAGNOSIS — I10 Essential (primary) hypertension: Secondary | ICD-10-CM

## 2019-07-01 DIAGNOSIS — Z0184 Encounter for antibody response examination: Secondary | ICD-10-CM

## 2019-07-01 NOTE — Telephone Encounter (Signed)
She gets free labs I think at [facility] she can get labs there or if she wants to come here ive ordered labs for here as well   [place]

## 2019-07-01 NOTE — Telephone Encounter (Signed)
mychart sent to inform patient. 

## 2019-07-07 ENCOUNTER — Other Ambulatory Visit (INDEPENDENT_AMBULATORY_CARE_PROVIDER_SITE_OTHER): Payer: BC Managed Care – PPO

## 2019-07-07 ENCOUNTER — Other Ambulatory Visit: Payer: Self-pay

## 2019-07-07 DIAGNOSIS — Z1389 Encounter for screening for other disorder: Secondary | ICD-10-CM | POA: Diagnosis not present

## 2019-07-07 DIAGNOSIS — Z0184 Encounter for antibody response examination: Secondary | ICD-10-CM | POA: Diagnosis not present

## 2019-07-07 DIAGNOSIS — Z1159 Encounter for screening for other viral diseases: Secondary | ICD-10-CM | POA: Diagnosis not present

## 2019-07-07 DIAGNOSIS — E039 Hypothyroidism, unspecified: Secondary | ICD-10-CM

## 2019-07-07 DIAGNOSIS — I10 Essential (primary) hypertension: Secondary | ICD-10-CM | POA: Diagnosis not present

## 2019-07-07 DIAGNOSIS — Z23 Encounter for immunization: Secondary | ICD-10-CM | POA: Diagnosis not present

## 2019-07-07 LAB — CBC WITH DIFFERENTIAL/PLATELET
Basophils Absolute: 0 10*3/uL (ref 0.0–0.1)
Basophils Relative: 0.6 % (ref 0.0–3.0)
Eosinophils Absolute: 0.2 10*3/uL (ref 0.0–0.7)
Eosinophils Relative: 3.1 % (ref 0.0–5.0)
HCT: 40.7 % (ref 36.0–46.0)
Hemoglobin: 13.6 g/dL (ref 12.0–15.0)
Lymphocytes Relative: 25.9 % (ref 12.0–46.0)
Lymphs Abs: 1.9 10*3/uL (ref 0.7–4.0)
MCHC: 33.4 g/dL (ref 30.0–36.0)
MCV: 88.3 fl (ref 78.0–100.0)
Monocytes Absolute: 0.6 10*3/uL (ref 0.1–1.0)
Monocytes Relative: 9 % (ref 3.0–12.0)
Neutro Abs: 4.4 10*3/uL (ref 1.4–7.7)
Neutrophils Relative %: 61.4 % (ref 43.0–77.0)
Platelets: 302 10*3/uL (ref 150.0–400.0)
RBC: 4.61 Mil/uL (ref 3.87–5.11)
RDW: 13.1 % (ref 11.5–15.5)
WBC: 7.2 10*3/uL (ref 4.0–10.5)

## 2019-07-07 LAB — COMPREHENSIVE METABOLIC PANEL
ALT: 13 U/L (ref 0–35)
AST: 18 U/L (ref 0–37)
Albumin: 4 g/dL (ref 3.5–5.2)
Alkaline Phosphatase: 62 U/L (ref 39–117)
BUN: 12 mg/dL (ref 6–23)
CO2: 28 mEq/L (ref 19–32)
Calcium: 9.3 mg/dL (ref 8.4–10.5)
Chloride: 103 mEq/L (ref 96–112)
Creatinine, Ser: 0.83 mg/dL (ref 0.40–1.20)
GFR: 76.36 mL/min (ref >60.00)
Glucose, Bld: 79 mg/dL (ref 70–99)
Potassium: 4 mEq/L (ref 3.5–5.1)
Sodium: 137 mEq/L (ref 135–145)
Total Bilirubin: 0.4 mg/dL (ref 0.2–1.2)
Total Protein: 6.9 g/dL (ref 6.0–8.3)

## 2019-07-07 LAB — LIPID PANEL
Cholesterol: 152 mg/dL (ref 0–200)
HDL: 74.4 mg/dL (ref >39.00)
LDL Cholesterol: 62 mg/dL (ref 0–99)
NonHDL: 77.64
Total CHOL/HDL Ratio: 2
Triglycerides: 77 mg/dL (ref 0.0–149.0)
VLDL: 15.4 mg/dL (ref 0.0–40.0)

## 2019-07-07 LAB — TSH: TSH: 1.45 u[IU]/mL (ref 0.35–4.50)

## 2019-07-07 NOTE — Addendum Note (Signed)
Addended by: Leeanne Rio on: 07/07/2019 08:48 AM   Modules accepted: Orders

## 2019-07-08 LAB — MEASLES/MUMPS/RUBELLA IMMUNITY
Mumps IgG: 48.1 AU/mL
Rubella: 4.86 index
Rubeola IgG: 25.2 AU/mL

## 2019-07-08 LAB — URINALYSIS, ROUTINE W REFLEX MICROSCOPIC
Bacteria, UA: NONE SEEN /HPF
Bilirubin Urine: NEGATIVE
Glucose, UA: NEGATIVE
Hyaline Cast: NONE SEEN /LPF
Ketones, ur: NEGATIVE
Leukocytes,Ua: NEGATIVE
Nitrite: NEGATIVE
Specific Gravity, Urine: 1.016 (ref 1.001–1.03)
pH: 5.5 (ref 5.0–8.0)

## 2019-07-14 ENCOUNTER — Other Ambulatory Visit: Payer: Self-pay | Admitting: Internal Medicine

## 2019-07-15 ENCOUNTER — Other Ambulatory Visit: Payer: Self-pay | Admitting: Internal Medicine

## 2019-07-15 DIAGNOSIS — R319 Hematuria, unspecified: Secondary | ICD-10-CM

## 2019-08-02 DIAGNOSIS — D2262 Melanocytic nevi of left upper limb, including shoulder: Secondary | ICD-10-CM | POA: Diagnosis not present

## 2019-08-02 DIAGNOSIS — D225 Melanocytic nevi of trunk: Secondary | ICD-10-CM | POA: Diagnosis not present

## 2019-08-02 DIAGNOSIS — D2272 Melanocytic nevi of left lower limb, including hip: Secondary | ICD-10-CM | POA: Diagnosis not present

## 2019-08-02 DIAGNOSIS — D2261 Melanocytic nevi of right upper limb, including shoulder: Secondary | ICD-10-CM | POA: Diagnosis not present

## 2019-08-16 ENCOUNTER — Other Ambulatory Visit: Payer: Self-pay

## 2019-08-16 DIAGNOSIS — Z20822 Contact with and (suspected) exposure to covid-19: Secondary | ICD-10-CM

## 2019-08-18 LAB — NOVEL CORONAVIRUS, NAA: SARS-CoV-2, NAA: NOT DETECTED

## 2019-10-12 ENCOUNTER — Ambulatory Visit (INDEPENDENT_AMBULATORY_CARE_PROVIDER_SITE_OTHER): Payer: BC Managed Care – PPO | Admitting: Internal Medicine

## 2019-10-12 ENCOUNTER — Other Ambulatory Visit: Payer: Self-pay

## 2019-10-12 ENCOUNTER — Other Ambulatory Visit (INDEPENDENT_AMBULATORY_CARE_PROVIDER_SITE_OTHER): Payer: BC Managed Care – PPO

## 2019-10-12 VITALS — Ht 63.0 in | Wt 181.0 lb

## 2019-10-12 DIAGNOSIS — Z0000 Encounter for general adult medical examination without abnormal findings: Secondary | ICD-10-CM | POA: Diagnosis not present

## 2019-10-12 DIAGNOSIS — F900 Attention-deficit hyperactivity disorder, predominantly inattentive type: Secondary | ICD-10-CM

## 2019-10-12 DIAGNOSIS — R319 Hematuria, unspecified: Secondary | ICD-10-CM

## 2019-10-12 HISTORY — DX: Encounter for general adult medical examination without abnormal findings: Z00.00

## 2019-10-12 MED ORDER — AMPHETAMINE-DEXTROAMPHET ER 20 MG PO CP24: 20.0000 mg | ORAL_CAPSULE | Freq: Every day | ORAL | 0 refills | Status: DC

## 2019-10-12 MED ORDER — AMPHETAMINE-DEXTROAMPHET ER 20 MG PO CP24: 20.0000 mg | ORAL_CAPSULE | ORAL | 0 refills | Status: DC

## 2019-10-12 NOTE — Progress Notes (Signed)
Virtual Visit via Video Note  I connected with Samantha Meyers   on 10/12/19 at  8:30 AM EST by a video enabled telemedicine application and verified that I am speaking with the correct person using two identifiers.  Location patient: home Location provider:work or home office Persons participating in the virtual visit: patient, provider, kdis  I discussed the limitations of evaluation and management by telemedicine and the availability of in person appointments. The patient expressed understanding and agreed to proceed.   HPI: 1. Annual  2. HTN on losartan 25 mg BP 110/75 at times  3. Anxiety/depression controlled on zoloft 50 mg qd  4. Hypothyroidism on levo 100 mg qd controlled  5. Hodgkins saw telemed [facility] 03/2019 still needs pap and MRI/mammo breast 6. adhd doing well on adderral 20 mg qd   ROS: See pertinent positives and negatives per HPI. General: wt stable  HEENT: normal hearing  CV: no chest pain  Lungs: no sob  GI: no ab pain  GU: blood in urine+ need to recheck labs 06/2019  MSK: no issues  Psych: mood stable  Neuro: no h/a Skin: no issues; had poison ivy summer 2020 resolved  Past Medical History:  Diagnosis Date  . Anxiety   . Cancer ([place])   . Depression   . Hodgkin's lymphoma ([place])    dx'ed 2005 tx'ed with chemo/radiation  . Hypertension   . Kidney stones   . Thyroid disease   . UTI (urinary tract infection)     Past Surgical History:  Procedure Laterality Date  . 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 PORT-A-CATH REMOVAL     2005   . TONSILLECTOMY     1987    Family History  Problem Relation Age of Onset  . Depression Mother        postpartum  . Hyperlipidemia Mother   . Cancer Father        lung in remission small cell cancer former smoker   . Depression Father   . Diabetes Father   . Hearing loss Father   . Hypertension Father   . Depression Sister   . ADD / ADHD Sister   . Kidney disease  Brother   . ADD / ADHD Brother   . Diabetes Son        DM 1   . Cancer Maternal Grandmother        breast   . Early death Paternal Grandmother   . Stroke Paternal Grandmother   . 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  albuterol (PROAIR HFA) 108 (90 Base) MCG/ACT inhaler, Inhale 1-2 puffs into the lungs every 4 (four) hours as needed for wheezing or shortness of breath., Disp: 18 g, Rfl: 2 .  amphetamine-dextroamphetamine (ADDERALL XR) 20 MG 24 hr capsule, Take 1 capsule (20 mg total) by mouth every morning. Fill 30 days 1/4, Disp: 30 capsule, Rfl: 0 .  levothyroxine (SYNTHROID) 100 MCG tablet, Take 1 tablet (100 mcg total) by mouth daily. Take in morning on empty stomach one hour before eating, Disp: 90 tablet, Rfl: 3 .  loratadine (CLARITIN) 10 MG tablet, Take 1 tablet (10 mg total) by mouth daily as needed for allergies., Disp: 90 tablet, Rfl: 3 .  losartan (COZAAR) 25 MG tablet, Take 1 tablet (25 mg total) by mouth daily., Disp: 90 tablet, Rfl: 3 .  Multiple Vitamin (MULTIVITAMIN WITH MINERALS) TABS tablet, Take 1 tablet by mouth daily., Disp: , Rfl:  .  sertraline (ZOLOFT) 50 MG tablet, Take 1 tablet (50 mg total) by mouth daily., Disp: 90 tablet, Rfl: 3 .  amphetamine-dextroamphetamine (ADDERALL XR) 20 MG 24 hr capsule, Take 1 capsule (20 mg total) by mouth daily. Rx 4/4 fill Q30 days, Disp: 30 capsule, Rfl: 0 .  amphetamine-dextroamphetamine (ADDERALL XR) 20 MG 24 hr capsule, Take 1 capsule (20 mg total) by mouth daily. Rx 3/4 fill Q30 days, Disp: 30 capsule, Rfl: 0 .  amphetamine-dextroamphetamine (ADDERALL XR) 20 MG 24 hr capsule, Take 1 capsule (20 mg total) by mouth daily. Rx 2/4 fill Q30 days, Disp: 30 capsule, Rfl: 0  EXAM:  VITALS per patient if applicable:  GENERAL: alert, oriented, appears well  and in no acute distress  HEENT: atraumatic, conjunttiva clear, no obvious abnormalities on inspection of external nose and ears  NECK: normal movements of the head and neck  LUNGS: on inspection no signs of respiratory distress, breathing rate appears normal, no obvious gross SOB, gasping or wheezing  CV: no obvious cyanosis  MS: moves all visible extremities without noticeable abnormality  PSYCH/NEURO: pleasant and cooperative, no obvious depression or anxiety, speech and thought processing grossly intact  ASSESSMENT AND PLAN:  Discussed the following assessment and plan:  Annual physical exam Had flu and Tdap rec mmr vaccinehad 1/2immune per labs 06/2019  MRI/mammo8/22/19 negordered by [facility] to f/u 05/2019 for h/o Hodgkins and will have EKG, mammo and breast MRI  -pt still needs to get this as of 10/12/2019   [facility] still has not scheduled encouraged patient to call  -pt still needs to get this as of 10/12/2019   Dermatology saw 06/2019 normal exam no bx  Hep B immune  Healthy diet and exercise rec  Repeat UA and culture hematuria orders in   ADHD (attention deficit hyperactivity disorder), inattentive type - Plan: amphetamine-dextroamphetamine (ADDERALL XR) 20 MG 24 hr capsule, amphetamine-dextroamphetamine (ADDERALL XR) 20 MG 24 hr capsule, amphetamine-dextroamphetamine (ADDERALL XR) 20 MG 24 hr capsule, amphetamine-dextroamphetamine (ADDERALL XR) 20 MG 24 hr capsule F/u Q4 months   F/u Dr. Amedeo Plenty upcoming right wrist pain c/w CTS   -we discussed possible serious and likely etiologies, options for evaluation and workup, limitations of telemedicine visit vs in person visit, treatment, treatment risks and precautions. Pt prefers to treat via telemedicine empirically rather then risking or undertaking an in person visit at this moment. Patient agrees to seek prompt in person care if worsening, new symptoms arise, or if is not improving  with treatment.   I discussed the assessment and treatment plan with the patient. The patient was provided an opportunity to ask questions and all were answered. The patient agreed with the plan and demonstrated an understanding of the instructions.   The patient was advised to call back or seek an in-person evaluation if the symptoms worsen or if the condition fails to improve as anticipated.  Time spent 20 min Delorise Jackson, MD

## 2019-10-13 LAB — URINALYSIS, ROUTINE W REFLEX MICROSCOPIC
Bacteria, UA: NONE SEEN /HPF
Bilirubin Urine: NEGATIVE
Glucose, UA: NEGATIVE
Hyaline Cast: NONE SEEN /LPF
Ketones, ur: NEGATIVE
Leukocytes,Ua: NEGATIVE
Nitrite: NEGATIVE
Specific Gravity, Urine: 1.018 (ref 1.001–1.03)
Squamous Epithelial / HPF: NONE SEEN /HPF (ref <=5)
WBC, UA: NONE SEEN /HPF (ref 0–5)
pH: 6.5 (ref 5.0–8.0)

## 2019-10-13 LAB — URINE CULTURE
MICRO NUMBER:: 1203824
SPECIMEN QUALITY:: ADEQUATE

## 2019-11-11 ENCOUNTER — Encounter: Payer: Self-pay | Admitting: Internal Medicine

## 2019-11-14 ENCOUNTER — Other Ambulatory Visit: Payer: Self-pay | Admitting: Internal Medicine

## 2019-11-14 DIAGNOSIS — F4323 Adjustment disorder with mixed anxiety and depressed mood: Secondary | ICD-10-CM

## 2019-11-14 DIAGNOSIS — G4700 Insomnia, unspecified: Secondary | ICD-10-CM

## 2019-11-14 DIAGNOSIS — F329 Major depressive disorder, single episode, unspecified: Secondary | ICD-10-CM

## 2019-11-14 DIAGNOSIS — F32A Depression, unspecified: Secondary | ICD-10-CM

## 2019-11-14 DIAGNOSIS — F439 Reaction to severe stress, unspecified: Secondary | ICD-10-CM

## 2019-11-14 DIAGNOSIS — F419 Anxiety disorder, unspecified: Secondary | ICD-10-CM

## 2019-12-05 ENCOUNTER — Encounter: Payer: Self-pay | Admitting: Internal Medicine

## 2019-12-05 DIAGNOSIS — Z0279 Encounter for issue of other medical certificate: Secondary | ICD-10-CM

## 2019-12-06 ENCOUNTER — Telehealth: Payer: Self-pay | Admitting: Internal Medicine

## 2019-12-06 NOTE — Telephone Encounter (Signed)
Paperwork ready  Copy and scan to files  Pt get original  Pay fee   Call pt ready to pick up FMLA

## 2019-12-06 NOTE — Telephone Encounter (Signed)
The dates starts 12-05-2019/02-13-2020 for FMLA. 10 Weeks. Patient has been informed of the below.

## 2019-12-06 NOTE — Telephone Encounter (Signed)
Same here hectic thanks for your patience with this paperwork this has been the most request for paperwork, covid, more messages and phone calls Ive ever experienced   What exact dates do you need 10-12 weeks? Ok got it [employer] to finish date? Let me know which start to finish dates to put I was actually going to message you able this?   Ill get brock to call you later today?    Dr. Harlene Ramus psychiatry in [place] does not take referrals you have to call and schedule your own appts and a deposit down  They may need notes from your therapist as well as psychiatry going forward for FMLA    Please ask pt above ? Need dates for fmla paperwork

## 2019-12-07 NOTE — Telephone Encounter (Signed)
Paperwork placed up front for pick up. Unable to leave VM, box was full.

## 2019-12-21 ENCOUNTER — Telehealth: Payer: Self-pay | Admitting: Internal Medicine

## 2019-12-21 NOTE — Telephone Encounter (Signed)
This referral 11/14/19 was supposed to be psychology/therapy not psychiatry  Reason for Referral: Dr. Sharen Heck phd [facility]  Anxiety/depression/stress/insomnia with physical manifestations I.e GI sx's unable to work due to stress   Was referral sent to both psychiatry and psychology/therapy -trying to clarify?   [place]

## 2019-12-22 ENCOUNTER — Encounter: Payer: Self-pay | Admitting: Internal Medicine

## 2019-12-23 NOTE — Telephone Encounter (Signed)
Phone # [phone] will have to call for fax

## 2019-12-23 NOTE — Telephone Encounter (Signed)
I'm waiting on fax number to Dr Sharen Heck.

## 2020-02-14 ENCOUNTER — Ambulatory Visit: Payer: BC Managed Care – PPO | Admitting: Internal Medicine

## 2020-02-14 DIAGNOSIS — F330 Major depressive disorder, recurrent, mild: Secondary | ICD-10-CM | POA: Diagnosis not present

## 2020-02-14 DIAGNOSIS — F900 Attention-deficit hyperactivity disorder, predominantly inattentive type: Secondary | ICD-10-CM | POA: Diagnosis not present

## 2020-02-14 DIAGNOSIS — F411 Generalized anxiety disorder: Secondary | ICD-10-CM | POA: Diagnosis not present

## 2020-02-15 ENCOUNTER — Encounter: Payer: Self-pay | Admitting: Internal Medicine

## 2020-02-15 ENCOUNTER — Ambulatory Visit (INDEPENDENT_AMBULATORY_CARE_PROVIDER_SITE_OTHER): Payer: BC Managed Care – PPO | Admitting: Internal Medicine

## 2020-02-15 ENCOUNTER — Other Ambulatory Visit: Payer: Self-pay

## 2020-02-15 VITALS — BP 114/82 | HR 80 | Temp 97.6°F | Ht 63.0 in | Wt 182.2 lb

## 2020-02-15 DIAGNOSIS — I10 Essential (primary) hypertension: Secondary | ICD-10-CM | POA: Diagnosis not present

## 2020-02-15 DIAGNOSIS — G5601 Carpal tunnel syndrome, right upper limb: Secondary | ICD-10-CM

## 2020-02-15 DIAGNOSIS — Z13220 Encounter for screening for lipoid disorders: Secondary | ICD-10-CM

## 2020-02-15 DIAGNOSIS — E039 Hypothyroidism, unspecified: Secondary | ICD-10-CM

## 2020-02-15 DIAGNOSIS — Z1329 Encounter for screening for other suspected endocrine disorder: Secondary | ICD-10-CM | POA: Diagnosis not present

## 2020-02-15 DIAGNOSIS — F900 Attention-deficit hyperactivity disorder, predominantly inattentive type: Secondary | ICD-10-CM | POA: Diagnosis not present

## 2020-02-15 HISTORY — DX: Carpal tunnel syndrome, right upper limb: G56.01

## 2020-02-15 MED ORDER — AMPHETAMINE-DEXTROAMPHET ER 20 MG PO CP24: 20.0000 mg | ORAL_CAPSULE | Freq: Every day | ORAL | 0 refills | Status: DC

## 2020-02-15 NOTE — Patient Instructions (Addendum)
[facility]  [facility] [place], [place] [address]  [phone]  Jacqulyn Liner, MD  [address] [place], [place] [address]  [phone]  [phone] (Fax   Mammogram and breast MRI due   [place]ika, Bloom, Insight Timer, calm, headspace meditation apps

## 2020-02-15 NOTE — Progress Notes (Signed)
Chief Complaint  Patient presents with  . Follow-up   F/u  1. Anxiety/depression doing better Gad 4 and phq 3 today on zoloft now taking in the am 50 mg due to taking qpm made feel tried saw Dr. Nicolasa Ducking 02/14/20 and rec same recs.Dr. Wallis Bamberg therapist no longer working and given a list She has been out of work at [employer] since 10/2019 and will be working on tenure 05/2020 and start again in 05/2020 this has helped with stress  2. Right hand/wrist pain OA and CTS doing better with brace  3. HTN on losartan 25 mg qd BP running 1teens/70s at home 4. Hypo on levo 100 mcg qd  Review of Systems  Constitutional: Negative for weight loss.  HENT: Negative for hearing loss.   Eyes: Negative for blurred vision.  Respiratory: Negative for shortness of breath.   Cardiovascular: Negative for chest pain.  Gastrointestinal: Negative for abdominal pain.  Musculoskeletal: Positive for joint pain.  Skin: Negative for rash.  Psychiatric/Behavioral: Negative for depression. The patient is not nervous/anxious.    Past Medical History:  Diagnosis Date  . Anxiety   . Cancer ([place])   . Depression   . Hodgkin's lymphoma ([place])    dx'ed 2005 tx'ed with chemo/radiation  . Hypertension   . Kidney stones   . Thyroid disease   . UTI (urinary tract infection)    Past Surgical History:  Procedure Laterality Date  . 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 PORT-A-CATH REMOVAL     2005   . TONSILLECTOMY     1987   Family History  Problem Relation Age of Onset  . Depression Mother        postpartum  . Hyperlipidemia Mother   . Cancer Father        lung in remission small cell cancer former smoker   . Depression Father   . Diabetes Father   . Hearing loss Father   . Hypertension Father   . Depression Sister   . ADD / ADHD Sister   . Kidney disease Brother   . ADD / ADHD Brother   . Diabetes Son        DM 1   . Cancer Maternal Grandmother        breast   . Early death  Paternal Grandmother   . Stroke Paternal Grandmother   . Eating disorder Sister    Social History   Socioeconomic History  . Marital status: Married    Spouse name: Not on file  . Number of children: Not on file  . Years of education: Not on file  . Highest education level: Not on file  Occupational History  . Not on file  Tobacco Use  . Smoking status: Never Smoker  . Smokeless tobacco: Never Used  Substance and Sexual Activity  . Alcohol use: Yes    Comment: occas.  . Drug use: No  . Sexual activity: Yes  Other Topics Concern  . 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 Difficulty of Paying Living Expenses:   Food Insecurity:   . Worried About Charity fundraiser in the Last Year:   .  Ran Out of Food in the Last Year:   Transportation Needs:   . Film/video editor (Medical):   Marland Kitchen Lack of Transportation (Non-Medical):   Physical Activity:   . Days of Exercise per Week:   . Minutes of Exercise per Session:   Stress:   . Feeling of Stress :   Social Connections:   . Frequency of Communication with Friends and Family:   . Frequency of Social Gatherings with Friends and Family:   . Attends Religious Services:   . Active Member of Clubs or Organizations:   . Attends Archivist Meetings:   Marland Kitchen Marital Status:   Intimate Partner Violence:   . Fear of Current or Ex-Partner:   . Emotionally Abused:   Marland Kitchen Physically Abused:   . Sexually Abused:    Current Meds  Medication Sig  . amphetamine-dextroamphetamine (ADDERALL XR) 20 MG 24 hr capsule Take 1 capsule (20 mg total) by mouth every morning. Fill 30 days 1/4  . amphetamine-dextroamphetamine (ADDERALL XR) 20 MG 24 hr capsule Take 1 capsule (20 mg total) by mouth daily. Rx 1/3 fill Q30 days  .  amphetamine-dextroamphetamine (ADDERALL XR) 20 MG 24 hr capsule Take 1 capsule (20 mg total) by mouth daily. Rx 2/3 fill Q30 days  . amphetamine-dextroamphetamine (ADDERALL XR) 20 MG 24 hr capsule Take 1 capsule (20 mg total) by mouth daily. Rx 3/3 fill Q30 days  . cholecalciferol (VITAMIN D3) 25 MCG (1000 UNIT) tablet Take 1,000 Units by mouth daily.  Marland Kitchen levothyroxine (SYNTHROID) 100 MCG tablet Take 1 tablet (100 mcg total) by mouth daily. Take in morning on empty stomach one hour before eating  . losartan (COZAAR) 25 MG tablet Take 1 tablet (25 mg total) by mouth daily.  . sertraline (ZOLOFT) 50 MG tablet Take 1 tablet (50 mg total) by mouth daily.  . [DISCONTINUED] amphetamine-dextroamphetamine (ADDERALL XR) 20 MG 24 hr capsule Take 1 capsule (20 mg total) by mouth daily. Rx 4/4 fill Q30 days  . [DISCONTINUED] amphetamine-dextroamphetamine (ADDERALL XR) 20 MG 24 hr capsule Take 1 capsule (20 mg total) by mouth daily. Rx 3/4 fill Q30 days  . [DISCONTINUED] amphetamine-dextroamphetamine (ADDERALL XR) 20 MG 24 hr capsule Take 1 capsule (20 mg total) by mouth daily. Rx 2/4 fill Q30 days   No Known Allergies No results found for this or any previous visit (from the past 2160 hour(s)). Objective  Body mass index is 32.28 kg/m. Wt Readings from Last 3 Encounters:  02/15/20 182 lb 3.2 oz (82.6 kg)  10/12/19 181 lb (82.1 kg)  05/26/19 184 lb 8 oz (83.7 kg)   Temp Readings from Last 3 Encounters:  02/15/20 97.6 F (36.4 C) (Temporal)  12/28/18 98.2 F (36.8 C) (Oral)  09/28/18 98.1 F (36.7 C) (Oral)   BP Readings from Last 3 Encounters:  02/15/20 114/82  12/28/18 106/62  09/28/18 122/90   Pulse Readings from Last 3 Encounters:  02/15/20 80  12/28/18 88  09/28/18 (!) 108    Physical Exam Vitals and nursing note reviewed.  Constitutional:      Appearance: Normal appearance. She is well-developed and well-groomed. She is obese.  HENT:     Head: Normocephalic and atraumatic.  Eyes:      Conjunctiva/sclera: Conjunctivae normal.     Pupils: Pupils are equal, round, and reactive to light.  Cardiovascular:     Rate and Rhythm: Normal rate and regular rhythm.     Heart sounds: Normal heart sounds. No murmur.  Pulmonary:  Effort: Pulmonary effort is normal.     Breath sounds: Normal breath sounds.  Abdominal:     General: Abdomen is flat. Bowel sounds are normal.     Tenderness: There is no abdominal tenderness.  Skin:    General: Skin is warm and dry.  Neurological:     General: No focal deficit present.     Mental Status: She is alert and oriented to person, place, and time. Mental status is at baseline.     Gait: Gait normal.  Psychiatric:        Attention and Perception: Attention and perception normal.        Mood and Affect: Mood and affect normal.        Speech: Speech normal.        Behavior: Behavior normal. Behavior is cooperative.        Thought Content: Thought content normal.        Cognition and Memory: Cognition and memory normal.        Judgment: Judgment normal.     Assessment  Plan  Essential hypertension - Plan: Comprehensive metabolic panel, Lipid panel, CBC with Differential/Platelet Controlled cont meds   Lipid screening - Plan: Lipid panel  hypothyroidism - Plan: TSH  ADHD (attention deficit hyperactivity disorder), inattentive type - Plan: amphetamine-dextroamphetamine (ADDERALL XR) 20 MG 24 hr capsule, amphetamine-dextroamphetamine (ADDERALL XR) 20 MG 24 hr capsule, amphetamine-dextroamphetamine (ADDERALL XR) 20 MG 24 hr capsule  Carpal tunnel syndrome of right wrist  Had flu and Tdap rec mmr vaccinehad 1/2immune per labs 06/2019  covid 2/2  MRI/mammo8/22/19 neg[facility]/u8/2020 for h/o Hodgkinsand will have EKG, mammo and breast MRI -pt still needs to get this as of 10/12/2019   [facility] still has not scheduled encouraged patient to call -pt still needs to get this as  of 10/12/2019   Dermatology saw 06/2019 normal exam no bx  Hep B immune  Healthy diet and exercise rec  Repeat UA and culture hematuria orders in   [facility] Dr. Lucila Maine due to f/u as of 01/2020 and due for breast MRI/mamo pt to call  Provider: Dr. Olivia Mackie McLean-Scocuzza-Internal Medicine

## 2020-02-22 ENCOUNTER — Telehealth: Payer: Self-pay | Admitting: Internal Medicine

## 2020-02-22 NOTE — Telephone Encounter (Signed)
Received request for patient's last office visit and most recent labs via signed release form. Information sent to fax and copy of form sent to scan.

## 2020-02-23 NOTE — Telephone Encounter (Signed)
Faxed office notes and labs to Dr. Nicolasa Ducking on 4/28

## 2020-03-01 ENCOUNTER — Encounter: Payer: Self-pay | Admitting: Internal Medicine

## 2020-03-01 NOTE — Progress Notes (Signed)
Three scripts for adderall faxed to patient's pharmacy [phone]. Fax confirmation received.   Date of completion and date of documentation different. Date of completion was 02/16/2020.

## 2020-03-05 DIAGNOSIS — F330 Major depressive disorder, recurrent, mild: Secondary | ICD-10-CM | POA: Diagnosis not present

## 2020-03-05 DIAGNOSIS — F411 Generalized anxiety disorder: Secondary | ICD-10-CM | POA: Diagnosis not present

## 2020-03-05 DIAGNOSIS — F900 Attention-deficit hyperactivity disorder, predominantly inattentive type: Secondary | ICD-10-CM | POA: Diagnosis not present

## 2020-03-22 ENCOUNTER — Other Ambulatory Visit: Payer: BC Managed Care – PPO

## 2020-03-29 ENCOUNTER — Other Ambulatory Visit: Payer: Self-pay

## 2020-03-29 ENCOUNTER — Other Ambulatory Visit: Payer: BC Managed Care – PPO

## 2020-03-29 DIAGNOSIS — I10 Essential (primary) hypertension: Secondary | ICD-10-CM

## 2020-03-29 DIAGNOSIS — F900 Attention-deficit hyperactivity disorder, predominantly inattentive type: Secondary | ICD-10-CM | POA: Diagnosis not present

## 2020-03-29 DIAGNOSIS — Z1329 Encounter for screening for other suspected endocrine disorder: Secondary | ICD-10-CM

## 2020-03-29 DIAGNOSIS — F330 Major depressive disorder, recurrent, mild: Secondary | ICD-10-CM | POA: Diagnosis not present

## 2020-03-29 DIAGNOSIS — F411 Generalized anxiety disorder: Secondary | ICD-10-CM | POA: Diagnosis not present

## 2020-03-29 DIAGNOSIS — Z13220 Encounter for screening for lipoid disorders: Secondary | ICD-10-CM

## 2020-03-29 NOTE — Addendum Note (Signed)
Addended by: Beckie Salts A on: 03/29/2020 08:49 AM   Modules accepted: Orders

## 2020-03-30 LAB — LIPID PANEL WITH LDL/HDL RATIO
Cholesterol, Total: 178 mg/dL (ref 100–199)
HDL: 78 mg/dL (ref >39)
LDL Chol Calc (NIH): 84 mg/dL (ref 0–99)
LDL/HDL Ratio: 1.1 ratio (ref 0.0–3.2)
Triglycerides: 89 mg/dL (ref 0–149)
VLDL Cholesterol Cal: 16 mg/dL (ref 5–40)

## 2020-03-30 LAB — CBC WITH DIFFERENTIAL/PLATELET
Basophils Absolute: 0.1 10*3/uL (ref 0.0–0.2)
Basos: 1 %
EOS (ABSOLUTE): 0.3 10*3/uL (ref 0.0–0.4)
Eos: 4 %
Hematocrit: 43.8 % (ref 34.0–46.6)
Hemoglobin: 14.4 g/dL (ref 11.1–15.9)
Immature Grans (Abs): 0 10*3/uL (ref 0.0–0.1)
Immature Granulocytes: 0 %
Lymphocytes Absolute: 2 10*3/uL (ref 0.7–3.1)
Lymphs: 28 %
MCH: 29.4 pg (ref 26.6–33.0)
MCHC: 32.9 g/dL (ref 31.5–35.7)
MCV: 90 fL (ref 79–97)
Monocytes Absolute: 0.6 10*3/uL (ref 0.1–0.9)
Monocytes: 9 %
Neutrophils Absolute: 4.1 10*3/uL (ref 1.4–7.0)
Neutrophils: 58 %
Platelets: 296 10*3/uL (ref 150–450)
RBC: 4.89 x10E6/uL (ref 3.77–5.28)
RDW: 12.6 % (ref 11.7–15.4)
WBC: 7 10*3/uL (ref 3.4–10.8)

## 2020-03-30 LAB — COMPREHENSIVE METABOLIC PANEL
ALT: 16 IU/L (ref 0–32)
AST: 20 IU/L (ref 0–40)
Albumin/Globulin Ratio: 1.7 (ref 1.2–2.2)
Albumin: 4.3 g/dL (ref 3.8–4.8)
Alkaline Phosphatase: 77 IU/L (ref 48–121)
BUN/Creatinine Ratio: 13 (ref 9–23)
BUN: 12 mg/dL (ref 6–24)
Bilirubin Total: 0.3 mg/dL (ref 0.0–1.2)
CO2: 23 mmol/L (ref 20–29)
Calcium: 9.5 mg/dL (ref 8.7–10.2)
Chloride: 103 mmol/L (ref 96–106)
Creatinine, Ser: 0.93 mg/dL (ref 0.57–1.00)
GFR calc Af Amer: 89 mL/min/{1.73_m2} (ref >59)
GFR calc non Af Amer: 77 mL/min/{1.73_m2} (ref >59)
Globulin, Total: 2.5 g/dL (ref 1.5–4.5)
Glucose: 92 mg/dL (ref 65–99)
Potassium: 4.8 mmol/L (ref 3.5–5.2)
Sodium: 140 mmol/L (ref 134–144)
Total Protein: 6.8 g/dL (ref 6.0–8.5)

## 2020-03-30 LAB — TSH: TSH: 0.527 u[IU]/mL (ref 0.450–4.500)

## 2020-05-29 DIAGNOSIS — F900 Attention-deficit hyperactivity disorder, predominantly inattentive type: Secondary | ICD-10-CM | POA: Diagnosis not present

## 2020-05-29 DIAGNOSIS — F330 Major depressive disorder, recurrent, mild: Secondary | ICD-10-CM | POA: Diagnosis not present

## 2020-05-29 DIAGNOSIS — F411 Generalized anxiety disorder: Secondary | ICD-10-CM | POA: Diagnosis not present

## 2020-06-04 DIAGNOSIS — M79672 Pain in left foot: Secondary | ICD-10-CM | POA: Diagnosis not present

## 2020-06-04 DIAGNOSIS — M76822 Posterior tibial tendinitis, left leg: Secondary | ICD-10-CM | POA: Diagnosis not present

## 2020-06-06 ENCOUNTER — Other Ambulatory Visit: Payer: Self-pay | Admitting: Internal Medicine

## 2020-06-06 DIAGNOSIS — I10 Essential (primary) hypertension: Secondary | ICD-10-CM

## 2020-06-06 DIAGNOSIS — E039 Hypothyroidism, unspecified: Secondary | ICD-10-CM

## 2020-06-06 DIAGNOSIS — J309 Allergic rhinitis, unspecified: Secondary | ICD-10-CM

## 2020-06-06 DIAGNOSIS — J9801 Acute bronchospasm: Secondary | ICD-10-CM

## 2020-06-06 DIAGNOSIS — F419 Anxiety disorder, unspecified: Secondary | ICD-10-CM

## 2020-06-06 MED ORDER — LEVOTHYROXINE SODIUM 100 MCG PO TABS: 100.0000 ug | ORAL_TABLET | Freq: Every day | ORAL | 3 refills | Status: DC

## 2020-06-06 MED ORDER — ALBUTEROL SULFATE HFA 108 (90 BASE) MCG/ACT IN AERS: 1.0000 | INHALATION_SPRAY | RESPIRATORY_TRACT | 2 refills | Status: DC | PRN

## 2020-06-06 MED ORDER — LOSARTAN POTASSIUM 25 MG PO TABS: 25.0000 mg | ORAL_TABLET | Freq: Every day | ORAL | 3 refills | Status: DC

## 2020-06-06 MED ORDER — LORATADINE 10 MG PO TABS: 10.0000 mg | ORAL_TABLET | Freq: Every day | ORAL | 3 refills | Status: DC | PRN

## 2020-06-13 ENCOUNTER — Other Ambulatory Visit: Payer: Self-pay

## 2020-06-13 ENCOUNTER — Ambulatory Visit: Payer: BC Managed Care – PPO | Admitting: Podiatry

## 2020-06-13 ENCOUNTER — Other Ambulatory Visit: Payer: Self-pay | Admitting: Podiatry

## 2020-06-13 DIAGNOSIS — M79672 Pain in left foot: Secondary | ICD-10-CM

## 2020-06-13 DIAGNOSIS — M722 Plantar fascial fibromatosis: Secondary | ICD-10-CM | POA: Diagnosis not present

## 2020-06-13 NOTE — Patient Instructions (Signed)

## 2020-06-14 ENCOUNTER — Encounter: Payer: Self-pay | Admitting: Podiatry

## 2020-06-14 NOTE — Progress Notes (Signed)
Subjective:  Patient ID: Samantha Meyers, female    DOB: May 21, 1981,  MRN: [id number]  Chief Complaint  Patient presents with  . Foot Pain    Lt medial arch pain radiates to bottom heel by the end of the day; 5/10 sharp pains x started last week in june - started after running w/ wrong shoes- w/ little swelling no bruising -[facility] took XRs, was dz with tendon injury last Monday Tx: cam boot, IBU, and stretching -wrose with pressure    39 y.o. female presents with the above complaint. Patient presents with left heel pain that has been going for couple months since last June. Patient states is 5 out of 10 is sharp shooting in nature. Patient has tried stretching it ibuprofen but none of that has helped. She would like to discuss treatment options. She states it started after running running with wrong shoes. She denies any other acute complaints. She has not seen anyone else prior to seeing me.   Review of Systems: Negative except as noted in the HPI. Denies N/V/F/Ch.  Past Medical History:  Diagnosis Date  . Anxiety   . Cancer ([place])   . Depression   . Hodgkin's lymphoma ([place])    dx'ed 2005 tx'ed with chemo/radiation  . Hypertension   . Kidney stones   . Thyroid disease   . UTI (urinary tract infection)     Current Outpatient Medications:  .  albuterol (PROAIR HFA) 108 (90 Base) MCG/ACT inhaler, Inhale 1-2 puffs into the lungs every 4 (four) hours as needed for wheezing or shortness of breath., Disp: 18 g, Rfl: 2 .  amphetamine-dextroamphetamine (ADDERALL XR) 20 MG 24 hr capsule, Take 1 capsule (20 mg total) by mouth daily. Rx 1/3 fill Q30 days, Disp: 30 capsule, Rfl: 0 .  cholecalciferol (VITAMIN D3) 25 MCG (1000 UNIT) tablet, Take 1,000 Units by mouth daily., Disp: , Rfl:  .  levothyroxine (SYNTHROID) 100 MCG tablet, Take 1 tablet (100 mcg total) by mouth daily. Take in morning on empty stomach one hour before eating, Disp: 90 tablet, Rfl: 3 .  loratadine (CLARITIN) 10 MG tablet,  Take 1 tablet (10 mg total) by mouth daily as needed for allergies., Disp: 90 tablet, Rfl: 3 .  losartan (COZAAR) 25 MG tablet, Take 1 tablet (25 mg total) by mouth daily., Disp: 90 tablet, Rfl: 3 .  sertraline (ZOLOFT) 50 MG tablet, Take 1 tablet (50 mg total) by mouth daily., Disp: 90 tablet, Rfl: 3  Social History   Tobacco Use  Smoking Status Never Smoker  Smokeless Tobacco Never Used    No Known Allergies Objective:  There were no vitals filed for this visit. There is no height or weight on file to calculate BMI. Constitutional Well developed. Well nourished.  Vascular Dorsalis pedis pulses palpable bilaterally. Posterior tibial pulses palpable bilaterally. Capillary refill normal to all digits.  No cyanosis or clubbing noted. Pedal hair growth normal.  Neurologic Normal speech. Oriented to person, place, and time. Epicritic sensation to light touch grossly present bilaterally.  Dermatologic Nails well groomed and normal in appearance. No open wounds. No skin lesions.  Orthopedic: Normal joint ROM without pain or crepitus bilaterally. No visible deformities. Tender to palpation at the calcaneal tuber left. No pain with calcaneal squeeze left. Ankle ROM diminished range of motion left. Silfverskiold Test: positive left.   Radiographs: Taken and reviewed. No acute fractures or dislocations. No evidence of stress fracture.  Plantar heel spur present. Posterior heel spur absent.   Assessment:  1. Plantar fasciitis of left foot   2. Pain of left heel    Plan:  Patient was evaluated and treated and all questions answered.  Plantar Fasciitis, left - XR reviewed as above.  - Educated on icing and stretching. Instructions given.  - Injection delivered to the plantar fascia as below. - DME: Plantar Fascial Brace - Pharmacologic management: None  Procedure: Injection Tendon/Ligament Location: Left plantar fascia at the glabrous junction; medial approach. Skin Prep:  alcohol Injectate: 0.5 cc 0.5% marcaine plain, 0.5 cc of 1% Lidocaine, 0.5 cc kenalog 10. Disposition: Patient tolerated procedure well. Injection site dressed with a band-aid.  No follow-ups on file.

## 2020-06-21 DIAGNOSIS — M25572 Pain in left ankle and joints of left foot: Secondary | ICD-10-CM | POA: Diagnosis not present

## 2020-07-12 ENCOUNTER — Ambulatory Visit: Payer: BC Managed Care – PPO | Admitting: Podiatry

## 2020-07-12 ENCOUNTER — Other Ambulatory Visit: Payer: Self-pay

## 2020-07-12 ENCOUNTER — Encounter: Payer: Self-pay | Admitting: Podiatry

## 2020-07-12 DIAGNOSIS — M76822 Posterior tibial tendinitis, left leg: Secondary | ICD-10-CM | POA: Diagnosis not present

## 2020-07-12 DIAGNOSIS — Q666 Other congenital valgus deformities of feet: Secondary | ICD-10-CM

## 2020-07-12 DIAGNOSIS — M722 Plantar fascial fibromatosis: Secondary | ICD-10-CM | POA: Diagnosis not present

## 2020-07-13 ENCOUNTER — Encounter: Payer: Self-pay | Admitting: Podiatry

## 2020-07-13 NOTE — Progress Notes (Signed)
Subjective:  Patient ID: Samantha Meyers, female    DOB: 1981-07-01,  MRN: [id number]  Chief Complaint  Patient presents with  . Plantar Fasciitis    "its not better.  It contantly aches and is worse at night"    39 y.o. female presents with the above complaint.  Patient presents with a follow-up of left plantar fasciitis.  Patient states she is doing well.  She does not have any pain at the plantar fascia.  Today she is having pain at the posterior tibial tendon in the inside part of the foot.  She states that it is constantly aching worse at night.  She has not tried anything else.  She denies any other acute complaints.   Review of Systems: Negative except as noted in the HPI. Denies N/V/F/Ch.  Past Medical History:  Diagnosis Date  . Anxiety   . Cancer ([place])   . Depression   . Hodgkin's lymphoma ([place])    dx'ed 2005 tx'ed with chemo/radiation  . Hypertension   . Kidney stones   . Thyroid disease   . UTI (urinary tract infection)     Current Outpatient Medications:  .  albuterol (PROAIR HFA) 108 (90 Base) MCG/ACT inhaler, Inhale 1-2 puffs into the lungs every 4 (four) hours as needed for wheezing or shortness of breath., Disp: 18 g, Rfl: 2 .  amphetamine-dextroamphetamine (ADDERALL XR) 20 MG 24 hr capsule, Take 1 capsule (20 mg total) by mouth daily. Rx 1/3 fill Q30 days, Disp: 30 capsule, Rfl: 0 .  cholecalciferol (VITAMIN D3) 25 MCG (1000 UNIT) tablet, Take 1,000 Units by mouth daily., Disp: , Rfl:  .  levothyroxine (SYNTHROID) 100 MCG tablet, Take 1 tablet (100 mcg total) by mouth daily. Take in morning on empty stomach one hour before eating, Disp: 90 tablet, Rfl: 3 .  loratadine (CLARITIN) 10 MG tablet, Take 1 tablet (10 mg total) by mouth daily as needed for allergies., Disp: 90 tablet, Rfl: 3 .  losartan (COZAAR) 25 MG tablet, Take 1 tablet (25 mg total) by mouth daily., Disp: 90 tablet, Rfl: 3 .  sertraline (ZOLOFT) 50 MG tablet, Take 1 tablet (50 mg total) by mouth  daily., Disp: 90 tablet, Rfl: 3  Social History   Tobacco Use  Smoking Status Never Smoker  Smokeless Tobacco Never Used    No Known Allergies Objective:  There were no vitals filed for this visit. There is no height or weight on file to calculate BMI. Constitutional Well developed. Well nourished.  Vascular Dorsalis pedis pulses palpable bilaterally. Posterior tibial pulses palpable bilaterally. Capillary refill normal to all digits.  No cyanosis or clubbing noted. Pedal hair growth normal.  Neurologic Normal speech. Oriented to person, place, and time. Epicritic sensation to light touch grossly present bilaterally.  Dermatologic Nails well groomed and normal in appearance. No open wounds. No skin lesions.  Orthopedic: Normal joint ROM without pain or crepitus bilaterally. No visible deformities. No tender to palpation at the calcaneal tuber left. No pain with calcaneal squeeze left. Ankle ROM diminished range of motion left. Silfverskiold Test: positive left.  Pain on palpation to the left posterior tibial tendon along the course of the tendon.  No pain along the course of it as well as insertion of the Teoh tendon.  No pain posterior malleolar aspect of the tibia.  Pain with plantarflexion and inversion of the foot resisted.  Pain with dorsiflexion eversion of the foot active and passive.   Radiographs: Taken and reviewed. No acute fractures or  dislocations. No evidence of stress fracture.  Plantar heel spur present. Posterior heel spur absent.   Assessment:   1. Plantar fasciitis of left foot   2. Posterior tibial tendinitis, left   3. Pes planovalgus    Plan:  Patient was evaluated and treated and all questions answered.  Plantar Fasciitis, left -Resolving/resolved.  Clinically patient does not have any pain at the calcaneal tuber.  Left posterior tibial tendinitis -I explained patient the etiology of posterior tibial tendinitis and various treatment options  were discussed.  Given that patient has mild to moderate pain along the course of the peroneal tendon especially at the insertion I believe she will benefit from Tri-Lock ankle brace to give stability and allow the tendon to heal appropriately.  Ultimately patient will benefit from custom-made orthotics to take the stress off of the posterior tibial tendon  Pes planovalgus with underlying posterior tibial tendinitis -I explained to patient the etiology of pes planovalgus and various treatment options were extensively discussed.  I believe patient will benefit from custom-made orthotics to help support the arch as well as control the hindfoot motion therefore taken the stress off of the posterior tibial tendon allow it to heal appropriately.  She will be scheduled see rec for custom-made orthotics   No follow-ups on file.

## 2020-07-18 ENCOUNTER — Other Ambulatory Visit: Payer: BC Managed Care – PPO | Admitting: Orthotics

## 2020-07-18 DIAGNOSIS — F332 Major depressive disorder, recurrent severe without psychotic features: Secondary | ICD-10-CM | POA: Diagnosis not present

## 2020-07-18 DIAGNOSIS — F411 Generalized anxiety disorder: Secondary | ICD-10-CM | POA: Diagnosis not present

## 2020-07-19 ENCOUNTER — Other Ambulatory Visit: Payer: Self-pay

## 2020-07-19 ENCOUNTER — Telehealth: Payer: BC Managed Care – PPO | Admitting: Nurse Practitioner

## 2020-07-19 ENCOUNTER — Ambulatory Visit: Payer: BC Managed Care – PPO

## 2020-07-19 DIAGNOSIS — Z20822 Contact with and (suspected) exposure to covid-19: Secondary | ICD-10-CM

## 2020-07-19 DIAGNOSIS — R059 Cough, unspecified: Secondary | ICD-10-CM

## 2020-07-19 LAB — POC COVID19 BINAXNOW: SARS Coronavirus 2 Ag: NEGATIVE

## 2020-07-19 NOTE — Progress Notes (Signed)
   Subjective:    Patient ID: Samantha Meyers, female    DOB: [birth year], 39 y.o.   MRN: [id number]  HPI 39 year old female with two close exposures within the past week to COVID-19. One was 07/13/20 (masked but within 6 feet for 30 minutes) second was 9/20 again masked. She was contacted by HR at [facility] due to the close exposures. She had a Rapid COVID this morning on campus that was negative, presents to [facility] today for repeat testing. She also had a Rapid test 07/16/20.   She has had a dry cough and scratchy throat that started yesterday.    She was fully vaccinated in April for COVID-19.  Also states she has allergies and there was an acute weather change within the past 24 hours.   No fever or other systemic symptoms   Review of Systems  HENT: Positive for sore throat.   Respiratory: Positive for cough.        Objective:   Physical Exam        Assessment & Plan:  This was a telehealth visit after nurse visit for Rapid Covid test. Patient had a negative rapid in clinic, will send PCR for confirmation since she is a close contact and now has symptom onset.   Given history of vaccination, multiple negative rapid tests, she may continue to normal activities with masking and physical distancing. She will treat symptoms with OTCs as discussed.   Will follow up with PCR when available-   If symptoms persist RTC for evaluation as discussed

## 2020-07-21 LAB — NOVEL CORONAVIRUS, NAA: SARS-CoV-2, NAA: NOT DETECTED

## 2020-07-21 LAB — SARS-COV-2, NAA 2 DAY TAT

## 2020-07-23 ENCOUNTER — Encounter: Payer: Self-pay | Admitting: Medical

## 2020-08-01 ENCOUNTER — Other Ambulatory Visit: Payer: Self-pay

## 2020-08-01 ENCOUNTER — Ambulatory Visit (INDEPENDENT_AMBULATORY_CARE_PROVIDER_SITE_OTHER): Payer: BC Managed Care – PPO | Admitting: Orthotics

## 2020-08-01 DIAGNOSIS — M722 Plantar fascial fibromatosis: Secondary | ICD-10-CM

## 2020-08-01 DIAGNOSIS — Q666 Other congenital valgus deformities of feet: Secondary | ICD-10-CM

## 2020-08-01 DIAGNOSIS — M76822 Posterior tibial tendinitis, left leg: Secondary | ICD-10-CM

## 2020-08-02 DIAGNOSIS — D2261 Melanocytic nevi of right upper limb, including shoulder: Secondary | ICD-10-CM | POA: Diagnosis not present

## 2020-08-02 DIAGNOSIS — D2271 Melanocytic nevi of right lower limb, including hip: Secondary | ICD-10-CM | POA: Diagnosis not present

## 2020-08-02 DIAGNOSIS — D2262 Melanocytic nevi of left upper limb, including shoulder: Secondary | ICD-10-CM | POA: Diagnosis not present

## 2020-08-02 DIAGNOSIS — D225 Melanocytic nevi of trunk: Secondary | ICD-10-CM | POA: Diagnosis not present

## 2020-08-03 DIAGNOSIS — F332 Major depressive disorder, recurrent severe without psychotic features: Secondary | ICD-10-CM | POA: Diagnosis not present

## 2020-08-03 DIAGNOSIS — F411 Generalized anxiety disorder: Secondary | ICD-10-CM | POA: Diagnosis not present

## 2020-08-08 DIAGNOSIS — F411 Generalized anxiety disorder: Secondary | ICD-10-CM | POA: Diagnosis not present

## 2020-08-08 DIAGNOSIS — F332 Major depressive disorder, recurrent severe without psychotic features: Secondary | ICD-10-CM | POA: Diagnosis not present

## 2020-08-09 ENCOUNTER — Other Ambulatory Visit: Payer: Self-pay

## 2020-08-09 ENCOUNTER — Encounter: Payer: Self-pay | Admitting: Podiatry

## 2020-08-09 ENCOUNTER — Ambulatory Visit: Payer: BC Managed Care – PPO | Admitting: Podiatry

## 2020-08-09 DIAGNOSIS — M76822 Posterior tibial tendinitis, left leg: Secondary | ICD-10-CM | POA: Diagnosis not present

## 2020-08-10 ENCOUNTER — Encounter: Payer: Self-pay | Admitting: Podiatry

## 2020-08-10 NOTE — Progress Notes (Signed)
Subjective:  Patient ID: Samantha Meyers, female    DOB: 07-19-1981,  MRN: [id number]  Chief Complaint  Patient presents with  . Plantar Fasciitis    "its a little better, the tendon still hurt"    39 y.o. female presents with the above complaint.  Patient presents with a follow-up of left posterior tibial tendinitis.  Patient states that she is doing a little bit better.  The brace helps a lot.  She was seen at [facility] where she had an MRI done for which she did not bring the copy of it.  She would like to know if she could do anything else conservative treatment options that does not include surgery.  She denies any other acute complaints.   Review of Systems: Negative except as noted in the HPI. Denies N/V/F/Ch.  Past Medical History:  Diagnosis Date  . Anxiety   . Cancer ([place])   . Depression   . Hodgkin's lymphoma ([place])    dx'ed 2005 tx'ed with chemo/radiation  . Hypertension   . Kidney stones   . Thyroid disease   . UTI (urinary tract infection)     Current Outpatient Medications:  .  albuterol (PROAIR HFA) 108 (90 Base) MCG/ACT inhaler, Inhale 1-2 puffs into the lungs every 4 (four) hours as needed for wheezing or shortness of breath., Disp: 18 g, Rfl: 2 .  amphetamine-dextroamphetamine (ADDERALL XR) 20 MG 24 hr capsule, Take 1 capsule (20 mg total) by mouth daily. Rx 1/3 fill Q30 days, Disp: 30 capsule, Rfl: 0 .  cholecalciferol (VITAMIN D3) 25 MCG (1000 UNIT) tablet, Take 1,000 Units by mouth daily., Disp: , Rfl:  .  levothyroxine (SYNTHROID) 100 MCG tablet, Take 1 tablet (100 mcg total) by mouth daily. Take in morning on empty stomach one hour before eating, Disp: 90 tablet, Rfl: 3 .  loratadine (CLARITIN) 10 MG tablet, Take 1 tablet (10 mg total) by mouth daily as needed for allergies., Disp: 90 tablet, Rfl: 3 .  losartan (COZAAR) 25 MG tablet, Take 1 tablet (25 mg total) by mouth daily., Disp: 90 tablet, Rfl: 3 .  sertraline (ZOLOFT) 50 MG tablet, Take 1 tablet  (50 mg total) by mouth daily., Disp: 90 tablet, Rfl: 3  Social History   Tobacco Use  Smoking Status Never Smoker  Smokeless Tobacco Never Used    No Known Allergies Objective:  There were no vitals filed for this visit. There is no height or weight on file to calculate BMI. Constitutional Well developed. Well nourished.  Vascular Dorsalis pedis pulses palpable bilaterally. Posterior tibial pulses palpable bilaterally. Capillary refill normal to all digits.  No cyanosis or clubbing noted. Pedal hair growth normal.  Neurologic Normal speech. Oriented to person, place, and time. Epicritic sensation to light touch grossly present bilaterally.  Dermatologic Nails well groomed and normal in appearance. No open wounds. No skin lesions.  Orthopedic: Normal joint ROM without pain or crepitus bilaterally. No visible deformities. No tender to palpation at the calcaneal tuber left. No pain with calcaneal squeeze left. Ankle ROM diminished range of motion left. Silfverskiold Test: positive left.  Pain on palpation to the left posterior tibial tendon along the course of the tendon.  No pain along the course of it as well as insertion of the Teoh tendon.  No pain posterior malleolar aspect of the tibia.  Pain with plantarflexion and inversion of the foot resisted.  Pain with dorsiflexion eversion of the foot active and passive.   Radiographs: Taken and reviewed. No acute  fractures or dislocations. No evidence of stress fracture.  Plantar heel spur present. Posterior heel spur absent.   MRI done at an outside office read: #1 plantar fasciitis and calcaneal spurring without tear or Baxters neuropathy #2 mild posterior tibial tenosynovitis and thinning, without tear or avulsion #3 chronic ossicles in the anterolateral gutter but intact major medial and lateral ankle ligaments.  Intact Lisfranc ligament 4 medial anteromedial tibiotalar spurring #5 capsulitis  Assessment:   1. Posterior  tibial tendinitis, left    Plan:  Patient was evaluated and treated and all questions answered.  Plantar Fasciitis, left -Resolving/resolved.  Clinically patient does not have any pain at the calcaneal tuber.  Left posterior tibial tendinitis -I explained patient the etiology of posterior tibial tendinitis and various treatment options were discussed.  -Continue utilizing Tri-Lock ankle brace for stability -Patient will drop off a copy of the MRI images for me to evaluate.  The read shows that there is mild tenosynovitis and thinning present without tear or avulsion.  Patient does have plantar fasciitis and calcaneal spurring. -I will give her a call to discuss it further once I take a look at the MRI copy -Patient was given a prescription for physical therapy  Pes planovalgus with underlying posterior tibial tendinitis -I explained to patient the etiology of pes planovalgus and various treatment options were extensively discussed.  I believe patient will benefit from custom-made orthotics to help support the arch as well as control the hindfoot motion therefore taken the stress off of the posterior tibial tendon allow it to heal appropriately.   -She will be scheduled see rec for custom-made orthotics   No follow-ups on file.

## 2020-08-15 ENCOUNTER — Encounter: Payer: Self-pay | Admitting: *Deleted

## 2020-08-16 DIAGNOSIS — M76822 Posterior tibial tendinitis, left leg: Secondary | ICD-10-CM | POA: Diagnosis not present

## 2020-08-16 DIAGNOSIS — M216X2 Other acquired deformities of left foot: Secondary | ICD-10-CM | POA: Diagnosis not present

## 2020-08-16 DIAGNOSIS — M722 Plantar fascial fibromatosis: Secondary | ICD-10-CM | POA: Diagnosis not present

## 2020-08-21 DIAGNOSIS — F900 Attention-deficit hyperactivity disorder, predominantly inattentive type: Secondary | ICD-10-CM | POA: Diagnosis not present

## 2020-08-21 DIAGNOSIS — F411 Generalized anxiety disorder: Secondary | ICD-10-CM | POA: Diagnosis not present

## 2020-08-21 DIAGNOSIS — F330 Major depressive disorder, recurrent, mild: Secondary | ICD-10-CM | POA: Diagnosis not present

## 2020-08-24 DIAGNOSIS — F411 Generalized anxiety disorder: Secondary | ICD-10-CM | POA: Diagnosis not present

## 2020-08-24 DIAGNOSIS — F332 Major depressive disorder, recurrent severe without psychotic features: Secondary | ICD-10-CM | POA: Diagnosis not present

## 2020-08-27 NOTE — Progress Notes (Signed)

## 2020-08-29 ENCOUNTER — Ambulatory Visit: Payer: BC Managed Care – PPO | Admitting: Orthotics

## 2020-08-29 ENCOUNTER — Other Ambulatory Visit: Payer: Self-pay

## 2020-08-29 DIAGNOSIS — M76822 Posterior tibial tendinitis, left leg: Secondary | ICD-10-CM

## 2020-08-29 DIAGNOSIS — F332 Major depressive disorder, recurrent severe without psychotic features: Secondary | ICD-10-CM | POA: Diagnosis not present

## 2020-08-29 DIAGNOSIS — F411 Generalized anxiety disorder: Secondary | ICD-10-CM | POA: Diagnosis not present

## 2020-08-29 NOTE — Progress Notes (Signed)
Patient came in today to pick up custom made foot orthotics.  The goals were accomplished and the patient reported no dissatisfaction with said orthotics.  Patient was advised of breakin period and how to report any issues. 

## 2020-09-06 ENCOUNTER — Ambulatory Visit: Payer: BC Managed Care – PPO | Admitting: Podiatry

## 2020-09-07 DIAGNOSIS — F332 Major depressive disorder, recurrent severe without psychotic features: Secondary | ICD-10-CM | POA: Diagnosis not present

## 2020-09-07 DIAGNOSIS — F411 Generalized anxiety disorder: Secondary | ICD-10-CM | POA: Diagnosis not present

## 2020-09-12 DIAGNOSIS — F332 Major depressive disorder, recurrent severe without psychotic features: Secondary | ICD-10-CM | POA: Diagnosis not present

## 2020-09-12 DIAGNOSIS — F411 Generalized anxiety disorder: Secondary | ICD-10-CM | POA: Diagnosis not present

## 2020-09-13 ENCOUNTER — Ambulatory Visit: Payer: BC Managed Care – PPO | Admitting: Podiatry

## 2020-09-27 ENCOUNTER — Ambulatory Visit: Payer: BC Managed Care – PPO | Admitting: Podiatry

## 2020-09-27 ENCOUNTER — Encounter: Payer: Self-pay | Admitting: Podiatry

## 2020-09-27 ENCOUNTER — Other Ambulatory Visit: Payer: Self-pay

## 2020-09-27 DIAGNOSIS — M76822 Posterior tibial tendinitis, left leg: Secondary | ICD-10-CM

## 2020-09-29 ENCOUNTER — Encounter: Payer: Self-pay | Admitting: Podiatry

## 2020-09-29 NOTE — Progress Notes (Signed)
Subjective:  Patient ID: Samantha Meyers, female    DOB: 1981-02-05,  MRN: [id number]  Chief Complaint  Patient presents with  . Foot Pain    "its better, but gets sore by the end of the day"    39 y.o. female presents with the above complaint.  Patient presents with a follow-up of left posterior tibial tendinitis.  Patient states that she is doing a little bit better.  The brace helps a lot.  I reviewed the MRI with the patient that she had done at [facility].  I also reviewed the disc as well.  At this time we will continue conservative treatment options.  She does still have some residual pain.   Review of Systems: Negative except as noted in the HPI. Denies N/V/F/Ch.  Past Medical History:  Diagnosis Date  . Anxiety   . Cancer ([place])   . Depression   . Hodgkin's lymphoma ([place])    dx'ed 2005 tx'ed with chemo/radiation  . Hypertension   . Kidney stones   . Thyroid disease   . UTI (urinary tract infection)     Current Outpatient Medications:  .  albuterol (PROAIR HFA) 108 (90 Base) MCG/ACT inhaler, Inhale 1-2 puffs into the lungs every 4 (four) hours as needed for wheezing or shortness of breath., Disp: 18 g, Rfl: 2 .  amphetamine-dextroamphetamine (ADDERALL XR) 20 MG 24 hr capsule, Take 1 capsule (20 mg total) by mouth daily. Rx 1/3 fill Q30 days, Disp: 30 capsule, Rfl: 0 .  cholecalciferol (VITAMIN D3) 25 MCG (1000 UNIT) tablet, Take 1,000 Units by mouth daily., Disp: , Rfl:  .  levothyroxine (SYNTHROID) 100 MCG tablet, Take 1 tablet (100 mcg total) by mouth daily. Take in morning on empty stomach one hour before eating, Disp: 90 tablet, Rfl: 3 .  loratadine (CLARITIN) 10 MG tablet, Take 1 tablet (10 mg total) by mouth daily as needed for allergies., Disp: 90 tablet, Rfl: 3 .  losartan (COZAAR) 25 MG tablet, Take 1 tablet (25 mg total) by mouth daily., Disp: 90 tablet, Rfl: 3 .  sertraline (ZOLOFT) 50 MG tablet, Take 1 tablet (50 mg total) by mouth daily., Disp: 90 tablet,  Rfl: 3  Social History   Tobacco Use  Smoking Status Never Smoker  Smokeless Tobacco Never Used    No Known Allergies Objective:  There were no vitals filed for this visit. There is no height or weight on file to calculate BMI. Constitutional Well developed. Well nourished.  Vascular Dorsalis pedis pulses palpable bilaterally. Posterior tibial pulses palpable bilaterally. Capillary refill normal to all digits.  No cyanosis or clubbing noted. Pedal hair growth normal.  Neurologic Normal speech. Oriented to person, place, and time. Epicritic sensation to light touch grossly present bilaterally.  Dermatologic Nails well groomed and normal in appearance. No open wounds. No skin lesions.  Orthopedic: Normal joint ROM without pain or crepitus bilaterally. No visible deformities. No tender to palpation at the calcaneal tuber left. No pain with calcaneal squeeze left. Ankle ROM diminished range of motion left. Silfverskiold Test: positive left.  Pain on palpation to the left posterior tibial tendon along the course of the tendon.  No pain along the course of it as well as insertion of the Teoh tendon.  No pain posterior malleolar aspect of the tibia.  Pain with plantarflexion and inversion of the foot resisted.  Pain with dorsiflexion eversion of the foot active and passive.   Radiographs: Taken and reviewed. No acute fractures or dislocations. No evidence of  stress fracture.  Plantar heel spur present. Posterior heel spur absent.   MRI done at an outside office read: #1 plantar fasciitis and calcaneal spurring without tear or Baxters neuropathy #2 mild posterior tibial tenosynovitis and thinning, without tear or avulsion #3 chronic ossicles in the anterolateral gutter but intact major medial and lateral ankle ligaments.  Intact Lisfranc ligament 4 medial anteromedial tibiotalar spurring #5 capsulitis  Assessment:   No diagnosis found. Plan:  Patient was evaluated and treated  and all questions answered.  Plantar Fasciitis, left -Resolving/resolved.  Clinically patient does not have any pain at the calcaneal tuber.  Left posterior tibial tendinitis -I explained patient the etiology of posterior tibial tendinitis and various treatment options were discussed.  -Continue utilizing Tri-Lock ankle brace for stability -I reviewed the MRI with the patient in extensive detail which she no showed mild tenosynovitis and thinning present without tear or avulsion of the posterior tibial tendon.  I discussed with the patient that if her pain does not get better we may have to surgically inspect the tendon and repair any kind of tearing along it.  Her pain has improved however still still has some residual pain.  For now she would like to allow orthotics to function properly and reassess over the next few weeks to see if her pain can be improved if not we will discuss surgical option at that time.  Patient states understanding  Pes planovalgus with underlying posterior tibial tendinitis -I explained to patient the etiology of pes planovalgus and various treatment options were extensively discussed.  I believe patient will benefit from custom-made orthotics to help support the arch as well as control the hindfoot motion therefore taken the stress off of the posterior tibial tendon allow it to heal appropriately.   -She has obtained orthotics and ambulating without any acute issues   No follow-ups on file.

## 2020-10-02 DIAGNOSIS — F332 Major depressive disorder, recurrent severe without psychotic features: Secondary | ICD-10-CM | POA: Diagnosis not present

## 2020-10-02 DIAGNOSIS — F411 Generalized anxiety disorder: Secondary | ICD-10-CM | POA: Diagnosis not present

## 2020-11-02 DIAGNOSIS — F411 Generalized anxiety disorder: Secondary | ICD-10-CM | POA: Diagnosis not present

## 2020-11-02 DIAGNOSIS — F332 Major depressive disorder, recurrent severe without psychotic features: Secondary | ICD-10-CM | POA: Diagnosis not present

## 2020-11-07 DIAGNOSIS — F411 Generalized anxiety disorder: Secondary | ICD-10-CM | POA: Diagnosis not present

## 2020-11-07 DIAGNOSIS — F332 Major depressive disorder, recurrent severe without psychotic features: Secondary | ICD-10-CM | POA: Diagnosis not present

## 2020-11-13 DIAGNOSIS — F330 Major depressive disorder, recurrent, mild: Secondary | ICD-10-CM | POA: Diagnosis not present

## 2020-11-13 DIAGNOSIS — F411 Generalized anxiety disorder: Secondary | ICD-10-CM | POA: Diagnosis not present

## 2020-11-13 DIAGNOSIS — F900 Attention-deficit hyperactivity disorder, predominantly inattentive type: Secondary | ICD-10-CM | POA: Diagnosis not present

## 2020-11-21 DIAGNOSIS — F332 Major depressive disorder, recurrent severe without psychotic features: Secondary | ICD-10-CM | POA: Diagnosis not present

## 2020-11-21 DIAGNOSIS — F411 Generalized anxiety disorder: Secondary | ICD-10-CM | POA: Diagnosis not present

## 2020-11-27 DIAGNOSIS — Z124 Encounter for screening for malignant neoplasm of cervix: Secondary | ICD-10-CM | POA: Diagnosis not present

## 2020-11-27 DIAGNOSIS — Z1231 Encounter for screening mammogram for malignant neoplasm of breast: Secondary | ICD-10-CM | POA: Diagnosis not present

## 2020-11-27 DIAGNOSIS — Z01419 Encounter for gynecological examination (general) (routine) without abnormal findings: Secondary | ICD-10-CM | POA: Diagnosis not present

## 2020-11-27 DIAGNOSIS — F330 Major depressive disorder, recurrent, mild: Secondary | ICD-10-CM | POA: Diagnosis not present

## 2020-11-27 DIAGNOSIS — F900 Attention-deficit hyperactivity disorder, predominantly inattentive type: Secondary | ICD-10-CM | POA: Diagnosis not present

## 2020-11-27 DIAGNOSIS — F411 Generalized anxiety disorder: Secondary | ICD-10-CM | POA: Diagnosis not present

## 2020-11-27 DIAGNOSIS — Z1331 Encounter for screening for depression: Secondary | ICD-10-CM | POA: Diagnosis not present

## 2020-11-27 LAB — HM PAP SMEAR: HM Pap smear: NORMAL

## 2020-12-05 DIAGNOSIS — F411 Generalized anxiety disorder: Secondary | ICD-10-CM | POA: Diagnosis not present

## 2020-12-05 DIAGNOSIS — F332 Major depressive disorder, recurrent severe without psychotic features: Secondary | ICD-10-CM | POA: Diagnosis not present

## 2020-12-12 DIAGNOSIS — F411 Generalized anxiety disorder: Secondary | ICD-10-CM | POA: Diagnosis not present

## 2020-12-12 DIAGNOSIS — F332 Major depressive disorder, recurrent severe without psychotic features: Secondary | ICD-10-CM | POA: Diagnosis not present

## 2020-12-26 DIAGNOSIS — F330 Major depressive disorder, recurrent, mild: Secondary | ICD-10-CM | POA: Diagnosis not present

## 2020-12-26 DIAGNOSIS — F900 Attention-deficit hyperactivity disorder, predominantly inattentive type: Secondary | ICD-10-CM | POA: Diagnosis not present

## 2020-12-26 DIAGNOSIS — F411 Generalized anxiety disorder: Secondary | ICD-10-CM | POA: Diagnosis not present

## 2020-12-26 DIAGNOSIS — F332 Major depressive disorder, recurrent severe without psychotic features: Secondary | ICD-10-CM | POA: Diagnosis not present

## 2021-01-02 DIAGNOSIS — F332 Major depressive disorder, recurrent severe without psychotic features: Secondary | ICD-10-CM | POA: Diagnosis not present

## 2021-01-02 DIAGNOSIS — F411 Generalized anxiety disorder: Secondary | ICD-10-CM | POA: Diagnosis not present

## 2021-01-16 DIAGNOSIS — F332 Major depressive disorder, recurrent severe without psychotic features: Secondary | ICD-10-CM | POA: Diagnosis not present

## 2021-01-16 DIAGNOSIS — F411 Generalized anxiety disorder: Secondary | ICD-10-CM | POA: Diagnosis not present

## 2021-01-23 DIAGNOSIS — F411 Generalized anxiety disorder: Secondary | ICD-10-CM | POA: Diagnosis not present

## 2021-01-23 DIAGNOSIS — F332 Major depressive disorder, recurrent severe without psychotic features: Secondary | ICD-10-CM | POA: Diagnosis not present

## 2021-01-30 DIAGNOSIS — F411 Generalized anxiety disorder: Secondary | ICD-10-CM | POA: Diagnosis not present

## 2021-01-30 DIAGNOSIS — F332 Major depressive disorder, recurrent severe without psychotic features: Secondary | ICD-10-CM | POA: Diagnosis not present

## 2021-02-04 DIAGNOSIS — F411 Generalized anxiety disorder: Secondary | ICD-10-CM | POA: Diagnosis not present

## 2021-02-04 DIAGNOSIS — F332 Major depressive disorder, recurrent severe without psychotic features: Secondary | ICD-10-CM | POA: Diagnosis not present

## 2021-02-20 DIAGNOSIS — F411 Generalized anxiety disorder: Secondary | ICD-10-CM | POA: Diagnosis not present

## 2021-02-20 DIAGNOSIS — F332 Major depressive disorder, recurrent severe without psychotic features: Secondary | ICD-10-CM | POA: Diagnosis not present

## 2021-02-24 DIAGNOSIS — U071 COVID-19: Secondary | ICD-10-CM

## 2021-02-24 HISTORY — DX: COVID-19: U07.1

## 2021-02-27 DIAGNOSIS — F332 Major depressive disorder, recurrent severe without psychotic features: Secondary | ICD-10-CM | POA: Diagnosis not present

## 2021-02-27 DIAGNOSIS — F411 Generalized anxiety disorder: Secondary | ICD-10-CM | POA: Diagnosis not present

## 2021-02-27 DIAGNOSIS — F900 Attention-deficit hyperactivity disorder, predominantly inattentive type: Secondary | ICD-10-CM | POA: Diagnosis not present

## 2021-02-27 DIAGNOSIS — F330 Major depressive disorder, recurrent, mild: Secondary | ICD-10-CM | POA: Diagnosis not present

## 2021-03-04 DIAGNOSIS — F411 Generalized anxiety disorder: Secondary | ICD-10-CM | POA: Diagnosis not present

## 2021-03-04 DIAGNOSIS — F332 Major depressive disorder, recurrent severe without psychotic features: Secondary | ICD-10-CM | POA: Diagnosis not present

## 2021-03-13 DIAGNOSIS — F332 Major depressive disorder, recurrent severe without psychotic features: Secondary | ICD-10-CM | POA: Diagnosis not present

## 2021-03-13 DIAGNOSIS — F411 Generalized anxiety disorder: Secondary | ICD-10-CM | POA: Diagnosis not present

## 2021-03-18 DIAGNOSIS — F332 Major depressive disorder, recurrent severe without psychotic features: Secondary | ICD-10-CM | POA: Diagnosis not present

## 2021-03-18 DIAGNOSIS — F411 Generalized anxiety disorder: Secondary | ICD-10-CM | POA: Diagnosis not present

## 2021-03-20 ENCOUNTER — Encounter: Payer: Self-pay | Admitting: Adult Health

## 2021-03-20 ENCOUNTER — Telehealth: Payer: Self-pay

## 2021-03-20 ENCOUNTER — Other Ambulatory Visit: Payer: Self-pay

## 2021-03-20 ENCOUNTER — Ambulatory Visit (INDEPENDENT_AMBULATORY_CARE_PROVIDER_SITE_OTHER): Payer: BC Managed Care – PPO | Admitting: Adult Health

## 2021-03-20 VITALS — Ht 64.0 in | Wt 181.0 lb

## 2021-03-20 DIAGNOSIS — Z20822 Contact with and (suspected) exposure to covid-19: Secondary | ICD-10-CM

## 2021-03-20 DIAGNOSIS — J069 Acute upper respiratory infection, unspecified: Secondary | ICD-10-CM

## 2021-03-20 NOTE — Progress Notes (Signed)
Virtual Visit via Video Note  I connected with Samantha Meyers on 03/20/21 at 10:30 AM EDT by a video enabled telemedicine application and verified that I am speaking with the correct person using two identifiers.   Parties involved in visit as below:   Location: Patient: at home  Provider: Provider: Provider's office at  [facility], [place].      I discussed the limitations of evaluation and management by telemedicine and the availability of in person appointments. The patient expressed understanding and agreed to proceed.  History of Present Illness: Patient has COVID exposure in the home.  She is the caregiver. She started with mild sore throat and very mild  Headache with slight nasal congestion last night, she has some fatigue. Onset 03/19/21.  41 year old son is positive for covid 2 days ago.  Denies any distress, chest pain, chest congestion or shortness of breath. Denies any cough.  She has history of 2005 Hodgkin's lymphoma had chemotherapy, radiation and she has a history of treatment.  Dr. Juel Meyers at [facility] is oncologist.   She is vaccinated x 2 and boosted.  Patient  denies any fever, body aches,chills, rash, chest pain, shortness of breath, nausea, vomiting, or diarrhea.    Observations/Objective:  Patient is alert and oriented and responsive to questions Engages in conversation with provider. Speaks in full sentences without any pauses without any shortness of breath or distress.  Assessment and Plan: The primary encounter diagnosis was Close exposure to COVID-19 virus. A diagnosis of Upper respiratory tract infection, unspecified type was also pertinent to this visit.   Recommend Covid,Influenza, RSV testing today at office parking lot.  Patient declined testing at this time, she has rapid test at home and has been negative so far.   She does have a history of immunosuppression in the past, is not on any immunocompromised  medications at this time. See HPI.  Advised patient of symptomatic treatment and also need for positive covid testing for antivirals.   Will also sent to [facility]-Scocuzza, Nino Glow, MD Patient also advised to inform her oncologist since he advised her to let  Him know if ill.  Follow Up Instructions:   Advised in person evaluation at anytime is advised if any symptoms do not improve, worsen or change at any given time.  Red Flags discussed. The patient was given clear instructions to go to ER or return to medical center if any red flags develop, symptoms do not improve, worsen or new problems develop. They verbalized understanding. Advised in person evaluation at anytime is advised if any symptoms do not improve, worsen or change at any given time.  Red Flags discussed. The patient was given clear instructions to go to ER or return to medical center if any red flags develop, symptoms do not improve, worsen or new problems develop. They verbalized understanding.   I discussed the assessment and treatment plan with the patient. The patient was provided an opportunity to ask questions and all were answered. The patient agreed with the plan and demonstrated an understanding of the instructions.   The patient was advised to call back or seek an in-person evaluation if the symptoms worsen or if the condition fails to improve as anticipated.     Marcille Buffy, FNP

## 2021-03-20 NOTE — Patient Instructions (Signed)
Influenza/ Covid  Testing av alible by appointment in our parking lot.   Advised in person evaluation at anytime is advised if any symptoms do not improve, worsen or change at any given time.  Red Flags discussed. The patient was given clear instructions to go to ER or return to medical center if any red flags develop, symptoms do not improve, worsen or new problems develop. They verbalized understanding. Upper Respiratory Infection, Adult An upper respiratory infection (URI) affects the nose, throat, and upper air passages. URIs are caused by germs (viruses). The most common type of URI is often called "the common cold." Medicines cannot cure URIs, but you can do things at home to relieve your symptoms. URIs usually get better within 7-10 days. Follow these instructions at home: Activity  Rest as needed.  If you have a fever, stay home from work or school until your fever is gone, or until your doctor says you may return to work or school. ? You should stay home until you cannot spread the infection anymore (you are not contagious). ? Your doctor may have you wear a face mask so you have less risk of spreading the infection. Relieving symptoms  Gargle with a salt-water mixture 3-4 times a day or as needed. To make a salt-water mixture, completely dissolve -1 tsp of salt in 1 cup of warm water.  Use a cool-mist humidifier to add moisture to the air. This can help you breathe more easily. Eating and drinking  Drink enough fluid to keep your pee (urine) pale yellow.  Eat soups and other clear broths.   General instructions  Take over-the-counter and prescription medicines only as told by your doctor. These include cold medicines, fever reducers, and cough suppressants.  Do not use any products that contain nicotine or tobacco. These include cigarettes and e-cigarettes. If you need help quitting, ask your doctor.  Avoid being where people are smoking (avoid secondhand smoke).  Make  sure you get regular shots and get the flu shot every year.  Keep all follow-up visits as told by your doctor. This is important.   How to avoid spreading infection to others  Wash your hands often with soap and water. If you do not have soap and water, use hand sanitizer.  Avoid touching your mouth, face, eyes, or nose.  Cough or sneeze into a tissue or your sleeve or elbow. Do not cough or sneeze into your hand or into the air.   Contact a doctor if:  You are getting worse, not better.  You have any of these: ? A fever. ? Chills. ? Brown or red mucus in your nose. ? Yellow or brown fluid (discharge)coming from your nose. ? Pain in your face, especially when you bend forward. ? Swollen neck glands. ? Pain with swallowing. ? White areas in the back of your throat. Get help right away if:  You have shortness of breath that gets worse.  You have very bad or constant: ? Headache. ? Ear pain. ? Pain in your forehead, behind your eyes, and over your cheekbones (sinus pain). ? Chest pain.  You have long-lasting (chronic) lung disease along with any of these: ? Wheezing. ? Long-lasting cough. ? Coughing up blood. ? A change in your usual mucus.  You have a stiff neck.  You have changes in your: ? Vision. ? Hearing. ? Thinking. ? Mood. Summary  An upper respiratory infection (URI) is caused by a germ called a virus. The most common type  of URI is often called "the common cold."  URIs usually get better within 7-10 days.  Take over-the-counter and prescription medicines only as told by your doctor. This information is not intended to replace advice given to you by your health care provider. Make sure you discuss any questions you have with your health care provider. Document Revised: 06/21/2020 Document Reviewed: 06/21/2020 Elsevier Patient Education  2021 [place]. How to Perform a Sinus Rinse A sinus rinse is a home treatment. It rinses your sinuses with a  mixture of salt and water (saline solution). Sinuses are air-filled spaces in your skull behind the bones of your face and forehead. They open into your nasal cavity. A sinus rinse can help to clear your nasal cavity. It can clear mucus, dirt, dust, or pollen. You may do a sinus rinse when you have:  A cold.  A virus.  Allergies.  A sinus infection.  A stuffy nose. Talk with your doctor about whether a sinus rinse might help you. What are the risks? A sinus rinse is normally very safe and helpful. However, there are a few risks. These include:  A burning feeling in the sinuses. This may happen if you do not make the saline solution as instructed. Be sure to follow all directions when making the saline solution.  Nasal irritation.  Infection from unclean water. This is rare, but possible. Do not do a sinus rinse if you have had:  Ear or nasal surgery.  An ear infection.  Blocked ears. Supplies needed:  Saline solution or powder.  Distilled or germ-free (sterile) water may be needed to mix with saline powder. ? You may use boiled and cooled tap water. Boil tap water for 5 minutes; cool until it is lukewarm. Use within 24 hours. ? Do not use regular tap water to mix with the saline solution.  Neti pot or nasal rinse bottle. This releases the saline solution into your nose and through your sinuses. You can buy neti pots and rinse bottles: ? At your local pharmacy. ? At a health food store. ? Online. How to perform a sinus rinse 1. Wash your hands with soap and water. 2. Wash your device using the directions that came with it. 3. Dry your device. 4. Use the solution that comes with your device or one that is sold separately in stores. Follow the mixing directions on the package if you need to mix with sterile or distilled water. 5. Fill your device with the amount of saline solution stated in the device instructions. 6. Stand over a sink and tilt your head sideways over the  sink. 7. Place the spout of the device in your upper nostril (the one closer to the ceiling). 8. Gently pour or squeeze the saline solution into your nasal cavity. The liquid should drain to your lower nostril if you are not too stuffed up (congested). 9. While rinsing, breathe through your open mouth. 10. Gently blow your nose to clear any mucus and rinse solution. Blowing too hard may cause ear pain. 11. Repeat in your other nostril. 12. Clean and rinse your device with clean water. 13. Air-dry your device. Talk with your doctor or pharmacist if you have questions about how to do a sinus rinse.   Summary  A sinus rinse is a home treatment. It rinses your sinuses with a mixture of salt and water (saline solution).  A sinus rinse is normally very safe and helpful. Follow all instructions carefully.  Talk with your  doctor about whether a sinus rinse might help you. This information is not intended to replace advice given to you by your health care provider. Make sure you discuss any questions you have with your health care provider. Document Revised: 07/24/2020 Document Reviewed: 07/24/2020 Elsevier Patient Education  2021 [employer].

## 2021-03-20 NOTE — Telephone Encounter (Signed)
Patient has been scheduled

## 2021-03-20 NOTE — Telephone Encounter (Signed)
Covid 19 exposure son + sore throat test neg for her  Please sch appt here or other site if available or [facility] urgent care

## 2021-03-20 NOTE — Progress Notes (Signed)
Patient was called by NP and advised of the below information as well.  Patient verbalized understanding of all instructions given and denies any further questions at this time.

## 2021-03-22 ENCOUNTER — Telehealth: Payer: Self-pay | Admitting: Internal Medicine

## 2021-03-22 ENCOUNTER — Other Ambulatory Visit: Payer: Self-pay | Admitting: Internal Medicine

## 2021-03-22 ENCOUNTER — Encounter: Payer: Self-pay | Admitting: Internal Medicine

## 2021-03-22 DIAGNOSIS — U071 COVID-19: Secondary | ICD-10-CM

## 2021-03-22 DIAGNOSIS — J309 Allergic rhinitis, unspecified: Secondary | ICD-10-CM

## 2021-03-22 DIAGNOSIS — J9801 Acute bronchospasm: Secondary | ICD-10-CM

## 2021-03-22 MED ORDER — ALBUTEROL SULFATE HFA 108 (90 BASE) MCG/ACT IN AERS: 1.0000 | INHALATION_SPRAY | RESPIRATORY_TRACT | 11 refills | Status: AC | PRN

## 2021-03-22 MED ORDER — MOLNUPIRAVIR 200 MG PO CAPS: 800.0000 mg | ORAL_CAPSULE | Freq: Two times a day (BID) | ORAL | 0 refills | Status: DC

## 2021-03-22 NOTE — Telephone Encounter (Signed)
Sent covid 19 medication and inhaler   Dr. Kelly Services

## 2021-03-22 NOTE — Telephone Encounter (Signed)
Called tested + covid 03/22/21 seen 03/20/21 provider in clinic  Sent in antiviral covid and albuterol inhaler  Dr. Olivia Mackie McLean-Scocuzza

## 2021-03-22 NOTE — Telephone Encounter (Signed)
Please sch in person f/u in 4 weeks s/p covid and CPE  Thank you

## 2021-03-22 NOTE — Telephone Encounter (Signed)
Pt called she saw [facility] on 03/20/21 she declined covid testing at that time. She tested positive for Covid today she has a mild sore throat, headache with slight nasal congestion and fatigue. She wanted to know if something could be called in

## 2021-03-22 NOTE — Telephone Encounter (Signed)
Spoke with Dr Olivia Mackie McLean-Scocuzza and she states that since Patient was seen she will send in medication for her.  Patient uses [facility]. And was tested positive today

## 2021-03-26 NOTE — Telephone Encounter (Signed)
Lvm to set up CPE in four weeks

## 2021-04-10 DIAGNOSIS — F332 Major depressive disorder, recurrent severe without psychotic features: Secondary | ICD-10-CM | POA: Diagnosis not present

## 2021-04-10 DIAGNOSIS — F411 Generalized anxiety disorder: Secondary | ICD-10-CM | POA: Diagnosis not present

## 2021-04-17 DIAGNOSIS — F411 Generalized anxiety disorder: Secondary | ICD-10-CM | POA: Diagnosis not present

## 2021-04-17 DIAGNOSIS — F332 Major depressive disorder, recurrent severe without psychotic features: Secondary | ICD-10-CM | POA: Diagnosis not present

## 2021-04-24 DIAGNOSIS — F411 Generalized anxiety disorder: Secondary | ICD-10-CM | POA: Diagnosis not present

## 2021-04-24 DIAGNOSIS — F332 Major depressive disorder, recurrent severe without psychotic features: Secondary | ICD-10-CM | POA: Diagnosis not present

## 2021-05-08 DIAGNOSIS — F332 Major depressive disorder, recurrent severe without psychotic features: Secondary | ICD-10-CM | POA: Diagnosis not present

## 2021-05-08 DIAGNOSIS — F411 Generalized anxiety disorder: Secondary | ICD-10-CM | POA: Diagnosis not present

## 2021-05-15 DIAGNOSIS — F332 Major depressive disorder, recurrent severe without psychotic features: Secondary | ICD-10-CM | POA: Diagnosis not present

## 2021-05-15 DIAGNOSIS — F411 Generalized anxiety disorder: Secondary | ICD-10-CM | POA: Diagnosis not present

## 2021-05-22 DIAGNOSIS — F411 Generalized anxiety disorder: Secondary | ICD-10-CM | POA: Diagnosis not present

## 2021-05-22 DIAGNOSIS — F332 Major depressive disorder, recurrent severe without psychotic features: Secondary | ICD-10-CM | POA: Diagnosis not present

## 2021-05-28 DIAGNOSIS — F900 Attention-deficit hyperactivity disorder, predominantly inattentive type: Secondary | ICD-10-CM | POA: Diagnosis not present

## 2021-05-28 DIAGNOSIS — F330 Major depressive disorder, recurrent, mild: Secondary | ICD-10-CM | POA: Diagnosis not present

## 2021-05-28 DIAGNOSIS — F411 Generalized anxiety disorder: Secondary | ICD-10-CM | POA: Diagnosis not present

## 2021-06-04 ENCOUNTER — Other Ambulatory Visit: Payer: Self-pay | Admitting: Internal Medicine

## 2021-06-04 DIAGNOSIS — E039 Hypothyroidism, unspecified: Secondary | ICD-10-CM

## 2021-06-04 DIAGNOSIS — I10 Essential (primary) hypertension: Secondary | ICD-10-CM

## 2021-06-07 ENCOUNTER — Encounter: Payer: Self-pay | Admitting: *Deleted

## 2021-06-10 ENCOUNTER — Other Ambulatory Visit: Payer: Self-pay | Admitting: Podiatry

## 2021-06-10 DIAGNOSIS — M79672 Pain in left foot: Secondary | ICD-10-CM

## 2021-07-05 DIAGNOSIS — F411 Generalized anxiety disorder: Secondary | ICD-10-CM | POA: Diagnosis not present

## 2021-07-05 DIAGNOSIS — F332 Major depressive disorder, recurrent severe without psychotic features: Secondary | ICD-10-CM | POA: Diagnosis not present

## 2021-07-10 DIAGNOSIS — F411 Generalized anxiety disorder: Secondary | ICD-10-CM | POA: Diagnosis not present

## 2021-07-10 DIAGNOSIS — F332 Major depressive disorder, recurrent severe without psychotic features: Secondary | ICD-10-CM | POA: Diagnosis not present

## 2021-07-17 DIAGNOSIS — F332 Major depressive disorder, recurrent severe without psychotic features: Secondary | ICD-10-CM | POA: Diagnosis not present

## 2021-07-17 DIAGNOSIS — F411 Generalized anxiety disorder: Secondary | ICD-10-CM | POA: Diagnosis not present

## 2021-08-05 DIAGNOSIS — F332 Major depressive disorder, recurrent severe without psychotic features: Secondary | ICD-10-CM | POA: Diagnosis not present

## 2021-08-05 DIAGNOSIS — F411 Generalized anxiety disorder: Secondary | ICD-10-CM | POA: Diagnosis not present

## 2021-08-06 DIAGNOSIS — D2271 Melanocytic nevi of right lower limb, including hip: Secondary | ICD-10-CM | POA: Diagnosis not present

## 2021-08-06 DIAGNOSIS — D2261 Melanocytic nevi of right upper limb, including shoulder: Secondary | ICD-10-CM | POA: Diagnosis not present

## 2021-08-06 DIAGNOSIS — D225 Melanocytic nevi of trunk: Secondary | ICD-10-CM | POA: Diagnosis not present

## 2021-08-06 DIAGNOSIS — D2262 Melanocytic nevi of left upper limb, including shoulder: Secondary | ICD-10-CM | POA: Diagnosis not present

## 2021-08-14 ENCOUNTER — Ambulatory Visit: Payer: BC Managed Care – PPO

## 2021-08-23 ENCOUNTER — Telehealth (INDEPENDENT_AMBULATORY_CARE_PROVIDER_SITE_OTHER): Payer: BC Managed Care – PPO | Admitting: Family

## 2021-08-23 ENCOUNTER — Encounter: Payer: Self-pay | Admitting: Family

## 2021-08-23 VITALS — Ht 64.02 in | Wt 184.0 lb

## 2021-08-23 DIAGNOSIS — J0101 Acute recurrent maxillary sinusitis: Secondary | ICD-10-CM | POA: Diagnosis not present

## 2021-08-23 DIAGNOSIS — J0100 Acute maxillary sinusitis, unspecified: Secondary | ICD-10-CM | POA: Insufficient documentation

## 2021-08-23 MED ORDER — AMOXICILLIN-POT CLAVULANATE 875-125 MG PO TABS: 1.0000 | ORAL_TABLET | Freq: Two times a day (BID) | ORAL | 0 refills | Status: AC

## 2021-08-23 NOTE — Assessment & Plan Note (Signed)
Patient well-appearing and nontoxic in appearance.  Based on duration of 2 months, absolutely appropriate to start with antibiotic therapy to see if symptoms completely resolved.  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 Referral placed.  Patient will let me know how she is doing.

## 2021-08-23 NOTE — Progress Notes (Signed)
Virtual Visit via Video Note  I connected with@  on 08/23/21 at  2:00 PM EDT by a video enabled telemedicine application and verified that I am speaking with the correct person using two identifiers.  Location patient: home Location provider:work  Persons participating in the virtual visit: patient, provider  I discussed the limitations of evaluation and management by telemedicine and the availability of in person appointments. The patient expressed understanding and agreed to proceed.   HPI: Visit Chief complaint sinus pressure and pain right-sided cheek and extends to right ear x 2 months, more painful the past 2 weeks. Endorses HA , not worse HA of life.  She has nasal spray OTC , sudafed with temporarily relief  No pain when eating, or tooth sensitivity, throat pain, cough, sob, wheezing, vision changes No Antibiotics in the last 3 months Right sided sinus congestion is typical for her.  Most notably since 2018 after she had a fall on right side of her face.  At that time she had a CT maxillofacial 12/04/2016 which revealed near complete opacification of the right maxillary sinus .She did not follow-up with ENT at that time.She is interested in revisiting ENT referral  History of hypertension, hypothyroidism, Hodgkin's lymphoma, seasonal allergies  ROS: See pertinent positives and negatives per HPI.    EXAM:  VITALS per patient if applicable: Ht 5' 4.19" (1.626 m)   Wt 184 lb (83.5 kg)   BMI 31.57 kg/m  BP Readings from Last 3 Encounters:  02/15/20 114/82  12/28/18 106/62  09/28/18 122/90   Wt Readings from Last 3 Encounters:  08/23/21 184 lb (83.5 kg)  03/20/21 181 lb (82.1 kg)  02/15/20 182 lb 3.2 oz (82.6 kg)    GENERAL: alert, oriented, appears well and in no acute distress  HEENT: atraumatic, conjunttiva clear, no obvious abnormalities on inspection of external nose and ears  NECK: normal movements of the head and neck  LUNGS: on inspection no signs of  respiratory distress, breathing rate appears normal, no obvious gross SOB, gasping or wheezing  CV: no obvious cyanosis  MS: moves all visible extremities without noticeable abnormality  PSYCH/NEURO: pleasant and cooperative, no obvious depression or anxiety, speech and thought processing grossly intact  ASSESSMENT AND PLAN:  Discussed the following assessment and plan:  Problem List Items Addressed This Visit       Respiratory   Sinusitis, acute, maxillary - Primary    Patient well-appearing and nontoxic in appearance.  Based on duration of 2 months, absolutely appropriate to start with antibiotic therapy to see if symptoms completely resolved.  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 Referral placed.  Patient will let me know how she is doing.       Relevant Medications   amoxicillin-clavulanate (AUGMENTIN) 875-125 MG tablet   Other Relevant Orders   Ambulatory referral to ENT    -we discussed possible serious and likely etiologies, options for evaluation and workup, limitations of telemedicine visit vs in person visit, treatment, treatment risks and precautions. Pt prefers to treat via telemedicine empirically rather then risking or undertaking an in person visit at this moment.  .   I discussed the assessment and treatment plan with the patient. The patient was provided an opportunity to ask questions and all were answered. The patient agreed with the plan and demonstrated an understanding of the instructions.  The patient was advised to call back or seek an in-person evaluation if the symptoms worsen or if the condition fails to improve as anticipated.   Mable Paris, FNP

## 2021-08-23 NOTE — Patient Instructions (Addendum)
I have sent in Augmentin for you to start.  may continue Mucinex ( plain).  I would avoid Sudafed as it can raise blood pressure.  Ensure to take probiotics while on antibiotics and also for 2 weeks after completion. This can either be by eating yogurt daily or taking a probiotic supplement over the counter such as Culturelle.It is important to re-colonize the gut with good bacteria and also to prevent any diarrheal infections associated with antibiotic use.   Referral to ENT Let us know if you dont hear back within a week in regards to an appointment being scheduled.    Please let me know if symptoms are completely resolved

## 2021-08-28 DIAGNOSIS — F900 Attention-deficit hyperactivity disorder, predominantly inattentive type: Secondary | ICD-10-CM | POA: Diagnosis not present

## 2021-08-28 DIAGNOSIS — F411 Generalized anxiety disorder: Secondary | ICD-10-CM | POA: Diagnosis not present

## 2021-08-28 DIAGNOSIS — F330 Major depressive disorder, recurrent, mild: Secondary | ICD-10-CM | POA: Diagnosis not present

## 2021-09-03 DIAGNOSIS — F900 Attention-deficit hyperactivity disorder, predominantly inattentive type: Secondary | ICD-10-CM | POA: Diagnosis not present

## 2021-09-03 DIAGNOSIS — F411 Generalized anxiety disorder: Secondary | ICD-10-CM | POA: Diagnosis not present

## 2021-09-03 DIAGNOSIS — F330 Major depressive disorder, recurrent, mild: Secondary | ICD-10-CM | POA: Diagnosis not present

## 2021-09-04 DIAGNOSIS — F900 Attention-deficit hyperactivity disorder, predominantly inattentive type: Secondary | ICD-10-CM | POA: Diagnosis not present

## 2021-09-04 DIAGNOSIS — F330 Major depressive disorder, recurrent, mild: Secondary | ICD-10-CM | POA: Diagnosis not present

## 2021-09-04 DIAGNOSIS — F332 Major depressive disorder, recurrent severe without psychotic features: Secondary | ICD-10-CM | POA: Diagnosis not present

## 2021-09-04 DIAGNOSIS — F411 Generalized anxiety disorder: Secondary | ICD-10-CM | POA: Diagnosis not present

## 2021-09-09 ENCOUNTER — Other Ambulatory Visit: Payer: Self-pay | Admitting: Internal Medicine

## 2021-09-09 DIAGNOSIS — I10 Essential (primary) hypertension: Secondary | ICD-10-CM

## 2021-09-09 DIAGNOSIS — E039 Hypothyroidism, unspecified: Secondary | ICD-10-CM

## 2021-09-10 DIAGNOSIS — F900 Attention-deficit hyperactivity disorder, predominantly inattentive type: Secondary | ICD-10-CM | POA: Diagnosis not present

## 2021-09-10 DIAGNOSIS — F411 Generalized anxiety disorder: Secondary | ICD-10-CM | POA: Diagnosis not present

## 2021-09-10 DIAGNOSIS — F330 Major depressive disorder, recurrent, mild: Secondary | ICD-10-CM | POA: Diagnosis not present

## 2021-09-13 DIAGNOSIS — F332 Major depressive disorder, recurrent severe without psychotic features: Secondary | ICD-10-CM | POA: Diagnosis not present

## 2021-09-13 DIAGNOSIS — F411 Generalized anxiety disorder: Secondary | ICD-10-CM | POA: Diagnosis not present

## 2021-09-24 ENCOUNTER — Other Ambulatory Visit: Payer: Self-pay

## 2021-09-24 ENCOUNTER — Ambulatory Visit
Admission: EM | Admit: 2021-09-24 | Discharge: 2021-09-24 | Disposition: A | Discharge status: Home / Self Care | Payer: BC Managed Care – PPO | Attending: Emergency Medicine | Admitting: Emergency Medicine

## 2021-09-24 ENCOUNTER — Encounter: Payer: Self-pay | Admitting: Emergency Medicine

## 2021-09-24 DIAGNOSIS — R1030 Lower abdominal pain, unspecified: Secondary | ICD-10-CM | POA: Diagnosis not present

## 2021-09-24 DIAGNOSIS — R319 Hematuria, unspecified: Secondary | ICD-10-CM | POA: Diagnosis not present

## 2021-09-24 DIAGNOSIS — R109 Unspecified abdominal pain: Secondary | ICD-10-CM | POA: Diagnosis not present

## 2021-09-24 DIAGNOSIS — F900 Attention-deficit hyperactivity disorder, predominantly inattentive type: Secondary | ICD-10-CM | POA: Diagnosis not present

## 2021-09-24 DIAGNOSIS — N390 Urinary tract infection, site not specified: Secondary | ICD-10-CM | POA: Diagnosis not present

## 2021-09-24 DIAGNOSIS — F330 Major depressive disorder, recurrent, mild: Secondary | ICD-10-CM | POA: Diagnosis not present

## 2021-09-24 DIAGNOSIS — F411 Generalized anxiety disorder: Secondary | ICD-10-CM | POA: Diagnosis not present

## 2021-09-24 LAB — POCT URINALYSIS DIP (MANUAL ENTRY)
Bilirubin, UA: NEGATIVE
Glucose, UA: NEGATIVE mg/dL
Nitrite, UA: POSITIVE — AB
Protein Ur, POC: >=300 mg/dL — AB
Spec Grav, UA: 1.025 (ref 1.010–1.025)
Urobilinogen, UA: 0.2 E.U./dL
pH, UA: 7 (ref 5.0–8.0)

## 2021-09-24 MED ORDER — CEPHALEXIN 500 MG PO CAPS: 500.0000 mg | ORAL_CAPSULE | Freq: Two times a day (BID) | ORAL | 0 refills | Status: AC

## 2021-09-24 NOTE — ED Provider Notes (Signed)
[name]    CSN: [id number] Arrival date & time: 09/24/21  1256      History   Chief Complaint Chief Complaint  Patient presents with   Dysuria    HPI Samantha Meyers is a 40 y.o. female.  Patient presents with 1 day history of dysuria and right flank pain.  She also reports discomfort in her lower abdomen.  She denies fever, chills, hematuria, vaginal discharge, pelvic pain, or other symptoms.  No treatments attempted at home.  Her medical history includes hypertension, hypothyroidism, Hodgkin's lymphoma.  The history is provided by the patient and medical records.   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      Home Medications    Prior to Admission medications   Medication Sig Start Date End Date Taking? Authorizing Provider   albuterol (PROAIR HFA) 108 (90 Base) MCG/ACT inhaler Inhale 1-2 puffs into the lungs every 4 (four) hours as needed for wheezing or shortness of breath. 03/22/21  Yes McLean-Scocuzza, Nino Glow, MD  cephALEXin (KEFLEX) 500 MG capsule Take 1 capsule (500 mg total) by mouth 2 (two) times daily for 5 days. 09/24/21 09/29/21 Yes Sharion Balloon, NP  levothyroxine (SYNTHROID) 100 MCG tablet TAKE 1 TABLET BY MOUTH DAILY. TAKE IN MORNING ON EMPTY STOMACH ONE HOUR BEFORE EATING 09/09/21  Yes McLean-Scocuzza, Nino Glow, MD  losartan (COZAAR) 25 MG tablet TAKE 1 TABLET BY MOUTH EVERY DAY 09/09/21  Yes McLean-Scocuzza, Nino Glow, MD  Molnupiravir 200 MG CAPS Take 4 capsules (800 mg total) by mouth in the morning and at bedtime. Patient not taking: Reported on 08/23/2021 03/22/21   McLean-Scocuzza, Nino Glow, MD  traZODone (DESYREL) 50 MG tablet Take 50 mg by mouth at bedtime as needed. 02/27/21  Yes [provider]  venlafaxine XR (EFFEXOR-XR) 150 MG 24 hr capsule Take 150 mg by mouth daily. 02/19/21  Yes [provider]  VYVANSE 10 MG capsule Take 10 mg by mouth daily. 08/16/21  Yes [provider]  amphetamine-dextroamphetamine (ADDERALL XR) 20 MG 24 hr capsule Take 1 capsule (20 mg total) by mouth daily. Rx 1/3 fill Q30 days 02/15/20   McLean-Scocuzza, Nino Glow, MD  cholecalciferol (VITAMIN D3) 25 MCG (1000 UNIT)  tablet Take 1,000 Units by mouth daily.    [provider]  loratadine (CLARITIN) 10 MG tablet Take 1 tablet (10 mg total) by mouth daily as needed for allergies. 06/06/20   McLean-Scocuzza, Nino Glow, MD  sertraline (ZOLOFT) 50 MG tablet Take 1 tablet (50 mg total) by mouth daily. 05/26/19   McLean-Scocuzza, Nino Glow, MD    Family History Family History  Problem Relation Age of Onset   Depression Mother        postpartum   Hyperlipidemia Mother    Cancer Father        lung in remission small cell cancer former smoker    Depression Father    Diabetes Father    Hearing loss  Father    Hypertension Father    Depression Sister    ADD / ADHD Sister    Kidney disease Brother    ADD / ADHD Brother    Diabetes Son        DM 1    Cancer Maternal Grandmother        breast    Early death Paternal Grandmother    Stroke Paternal Grandmother    Eating disorder Sister     Social History Social History   Tobacco Use   Smoking status: Never   Smokeless tobacco: Never  Vaping Use   Vaping Use: Never used  Substance Use Topics   Alcohol use: Yes    Comment: occas.   Drug use: No     Allergies   Patient has no known allergies.   Review of Systems Review of Systems  Constitutional:  Negative for chills and fever.  Respiratory:  Negative for cough and shortness of breath.   Cardiovascular:  Negative for chest pain and palpitations.  Gastrointestinal:  Positive for abdominal pain. Negative for vomiting.  Genitourinary:  Positive for dysuria and flank pain. Negative for hematuria, pelvic pain and vaginal discharge.  Skin:  Negative for color change and rash.  All other systems reviewed and are negative.   Physical Exam Triage Vital Signs ED Triage Vitals  Enc Vitals Group     BP 09/24/21 1420 (!) 133/92     Pulse Rate 09/24/21 1420 91     Resp 09/24/21 1420 18     Temp 09/24/21 1420 98.8 F (37.1 C)     Temp Source 09/24/21 1420 Oral     SpO2 09/24/21 1420 98 %     Weight --      Height --      Head Circumference --      Peak Flow --      Pain Score 09/24/21 1418 4     Pain Loc --      Pain Edu? --      Excl. in [place]? --    No data found.  Updated Vital Signs BP (!) 133/92 (BP Location: Left Arm)   Pulse 91   Temp 98.8 F (37.1 C) (Oral)   Resp 18   LMP 09/09/2021   SpO2 98%   Visual Acuity Right Eye Distance:   Left Eye Distance:   Bilateral Distance:    Right Eye Near:   Left Eye Near:    Bilateral Near:     Physical Exam Vitals and nursing note reviewed.  Constitutional:      General: She is not in acute distress.     Appearance: She is well-developed. She is not ill-appearing.  HENT:     Head: Normocephalic and atraumatic.  Mouth/Throat:     Mouth: Mucous membranes are moist.  Cardiovascular:     Rate and Rhythm: Normal rate and regular rhythm.     Heart sounds: Normal heart sounds.  Pulmonary:     Effort: Pulmonary effort is normal. No respiratory distress.     Breath sounds: Normal breath sounds.  Abdominal:     General: Bowel sounds are normal.     Palpations: Abdomen is soft.     Tenderness: There is no abdominal tenderness. There is right CVA tenderness. There is no left CVA tenderness, guarding or rebound.  Musculoskeletal:     Cervical back: Neck supple.  Skin:    General: Skin is warm and dry.  Neurological:     Mental Status: She is alert.  Psychiatric:        Mood and Affect: Mood normal.        Behavior: Behavior normal.     UC Treatments / Results  Labs (all labs ordered are listed, but only abnormal results are displayed) Labs Reviewed  POCT URINALYSIS DIP (MANUAL ENTRY) - Abnormal; Notable for the following components:      Result Value   Clarity, UA cloudy (*)    Ketones, POC UA trace (5) (*)    Blood, UA large (*)    Protein Ur, POC >=300 (*)    Nitrite, UA Positive (*)    Leukocytes, UA Large (3+) (*)    All other components within normal limits  URINE CULTURE    EKG   Radiology No results found.  Procedures Procedures (including critical care time)  Medications Ordered in UC Medications - No data to display  Initial Impression / Assessment and Plan / UC Course  I have reviewed the triage vital signs and the nursing notes.  Pertinent labs & imaging results that were available during my care of the patient were reviewed by me and considered in my medical decision making (see chart for details).  Dysuria, abdominal pain, right flank pain.  Patient is afebrile and well-appearing.  Treating with Keflex. Urine culture pending. Discussed with patient that  we will call her if the urine culture shows the need to change or discontinue the antibiotic. Instructed her to follow-up with her PCP tomorrow.  ED precautions discussed.  Patient agrees to plan of care.       Final Clinical Impressions(s) / UC Diagnoses   Final diagnoses:  Urinary tract infection with hematuria, site unspecified  Lower abdominal pain  Acute right flank pain     Discharge Instructions      Go to the emergency department if you have worsening symptoms including abdominal pain or flank pain; or if you develop new symptoms such as fever.  Take the antibiotic as directed.  The urine culture is pending.  We will call you if it shows the need to change or discontinue your antibiotic.    Follow up with your primary care provider tomorrow.       ED Prescriptions     Medication Sig Dispense Auth. Provider   cephALEXin (KEFLEX) 500 MG capsule Take 1 capsule (500 mg total) by mouth 2 (two) times daily for 5 days. 10 capsule Sharion Balloon, NP      PDMP not reviewed this encounter.   Sharion Balloon, NP 09/24/21 1459

## 2021-09-24 NOTE — ED Triage Notes (Signed)
Pt c/o dysuria sxs started yesterday.

## 2021-09-24 NOTE — Discharge Instructions (Addendum)
Go to the emergency department if you have worsening symptoms including abdominal pain or flank pain; or if you develop new symptoms such as fever.  Take the antibiotic as directed.  The urine culture is pending.  We will call you if it shows the need to change or discontinue your antibiotic.    Follow up with your primary care provider tomorrow.

## 2021-09-26 DIAGNOSIS — J342 Deviated nasal septum: Secondary | ICD-10-CM | POA: Diagnosis not present

## 2021-09-26 DIAGNOSIS — J301 Allergic rhinitis due to pollen: Secondary | ICD-10-CM | POA: Diagnosis not present

## 2021-09-27 DIAGNOSIS — F332 Major depressive disorder, recurrent severe without psychotic features: Secondary | ICD-10-CM | POA: Diagnosis not present

## 2021-09-27 DIAGNOSIS — F411 Generalized anxiety disorder: Secondary | ICD-10-CM | POA: Diagnosis not present

## 2021-09-27 LAB — URINE CULTURE: Culture: >=100000 — AB

## 2021-10-02 ENCOUNTER — Other Ambulatory Visit (HOSPITAL_COMMUNITY)
Admission: RE | Admit: 2021-10-02 | Discharge: 2021-10-02 | Disposition: A | Discharge status: Home / Self Care | Payer: BC Managed Care – PPO | Source: Ambulatory Visit | Attending: Internal Medicine | Admitting: Internal Medicine

## 2021-10-02 ENCOUNTER — Encounter: Payer: Self-pay | Admitting: Internal Medicine

## 2021-10-02 ENCOUNTER — Other Ambulatory Visit: Payer: Self-pay

## 2021-10-02 ENCOUNTER — Ambulatory Visit (INDEPENDENT_AMBULATORY_CARE_PROVIDER_SITE_OTHER): Payer: BC Managed Care – PPO | Admitting: Internal Medicine

## 2021-10-02 VITALS — BP 122/84 | HR 91 | Temp 97.0°F | Ht 64.13 in | Wt 179.6 lb

## 2021-10-02 DIAGNOSIS — Z113 Encounter for screening for infections with a predominantly sexual mode of transmission: Secondary | ICD-10-CM | POA: Diagnosis not present

## 2021-10-02 DIAGNOSIS — E538 Deficiency of other specified B group vitamins: Secondary | ICD-10-CM

## 2021-10-02 DIAGNOSIS — M7752 Other enthesopathy of left foot: Secondary | ICD-10-CM | POA: Insufficient documentation

## 2021-10-02 DIAGNOSIS — Z0000 Encounter for general adult medical examination without abnormal findings: Secondary | ICD-10-CM

## 2021-10-02 DIAGNOSIS — E039 Hypothyroidism, unspecified: Secondary | ICD-10-CM

## 2021-10-02 DIAGNOSIS — I10 Essential (primary) hypertension: Secondary | ICD-10-CM | POA: Diagnosis not present

## 2021-10-02 DIAGNOSIS — M779 Enthesopathy, unspecified: Secondary | ICD-10-CM | POA: Insufficient documentation

## 2021-10-02 DIAGNOSIS — E559 Vitamin D deficiency, unspecified: Secondary | ICD-10-CM

## 2021-10-02 DIAGNOSIS — Z1231 Encounter for screening mammogram for malignant neoplasm of breast: Secondary | ICD-10-CM

## 2021-10-02 DIAGNOSIS — Z8571 Personal history of Hodgkin lymphoma: Secondary | ICD-10-CM | POA: Diagnosis not present

## 2021-10-02 DIAGNOSIS — M778 Other enthesopathies, not elsewhere classified: Secondary | ICD-10-CM | POA: Insufficient documentation

## 2021-10-02 DIAGNOSIS — B3731 Acute candidiasis of vulva and vagina: Secondary | ICD-10-CM

## 2021-10-02 DIAGNOSIS — Z3040 Encounter for surveillance of contraceptives, unspecified: Secondary | ICD-10-CM

## 2021-10-02 DIAGNOSIS — N3000 Acute cystitis without hematuria: Secondary | ICD-10-CM

## 2021-10-02 DIAGNOSIS — R399 Unspecified symptoms and signs involving the genitourinary system: Secondary | ICD-10-CM | POA: Diagnosis not present

## 2021-10-02 DIAGNOSIS — N390 Urinary tract infection, site not specified: Secondary | ICD-10-CM | POA: Diagnosis not present

## 2021-10-02 HISTORY — DX: Other enthesopathy of left foot and ankle: M77.52

## 2021-10-02 HISTORY — DX: Other enthesopathies, not elsewhere classified: M77.8

## 2021-10-02 HISTORY — DX: Enthesopathy, unspecified: M77.9

## 2021-10-02 MED ORDER — LOSARTAN POTASSIUM 25 MG PO TABS: 25.0000 mg | ORAL_TABLET | Freq: Every day | ORAL | 3 refills | Status: DC

## 2021-10-02 MED ORDER — FLUCONAZOLE 150 MG PO TABS: 150.0000 mg | ORAL_TABLET | Freq: Once | ORAL | 0 refills | Status: AC

## 2021-10-02 MED ORDER — LEVOTHYROXINE SODIUM 100 MCG PO TABS: ORAL_TABLET | ORAL | 3 refills | Status: DC

## 2021-10-02 NOTE — Progress Notes (Signed)
Chief Complaint  Patient presents with   Annual Exam   Annual  1. Htn controlled on losartan 25 mg qd  2. Hypothyroidism since age 40 on synthyroid 100 mcg qd  3. 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 Right wrist tendonitis left ankle tendonitis rec she f/u [facility] h/o cipro use  5. Wants std check now separated from husband 6. Wants referral ob/gyn contraception management Dr. Linda Hedges  Review of Systems  Constitutional:  Negative for weight loss.  HENT:  Negative for hearing loss.   Eyes:  Negative for blurred vision.  Respiratory:  Negative for shortness of breath.   Cardiovascular:  Negative for chest pain.  Gastrointestinal:  Positive for abdominal pain. Negative for blood in stool.  Genitourinary:  Positive for dysuria.  Musculoskeletal:  Positive for back pain, joint pain and myalgias. Negative for falls.  Skin:  Negative for rash.  Neurological:  Negative for headaches.  Psychiatric/Behavioral:  Negative for depression.   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    PORT-A-CATH REMOVAL     2005    TONSILLECTOMY     1987   Family History  Problem Relation Age of Onset   Depression Mother        postpartum   Hyperlipidemia Mother    Cancer Father        lung in remission small cell cancer former smoker    Depression Father    Diabetes Father    Hearing loss Father    Hypertension Father    Depression Sister    ADD / ADHD  Sister    Kidney disease Brother    ADD / ADHD Brother    Diabetes Son        DM 1    Cancer Maternal Grandmother        breast    Early death Paternal Grandmother    Stroke Paternal Grandmother    Eating disorder Sister    Social History   Socioeconomic History   Marital status: Married    Spouse name: Not on file   Number of children: Not on file   Years of education: Not on file   Highest education level: Not on file  Occupational History   Not on file  Tobacco Use   Smoking status: Never   Smokeless tobacco: Never  Vaping Use   Vaping Use: Never used  Substance and Sexual Activity   Alcohol use: Yes    Comment: occas.   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 For 10 days   cholecalciferol (VITAMIN D3) 25 MCG (1000 UNIT) tablet Take 1,000 Units by mouth daily.   fluconazole (DIFLUCAN) 150 MG tablet Take 1 tablet (150 mg total) by mouth once for 1 dose. Repeat dose in 3 days if needed   levothyroxine (SYNTHROID) 100 MCG tablet TAKE 1 TABLET BY MOUTH DAILY. TAKE IN MORNING ON EMPTY STOMACH ONE HOUR BEFORE EATING   losartan (COZAAR) 25 MG tablet TAKE 1 TABLET BY MOUTH EVERY DAY   Molnupiravir 200 MG CAPS Take 4 capsules (800 mg total) by mouth in the morning and at bedtime.   traZODone (DESYREL) 50 MG tablet Take 50 mg by mouth at bedtime as  needed.   venlafaxine XR (EFFEXOR-XR) 150 MG 24 hr capsule Take 150 mg by mouth daily.   VYVANSE 10 MG capsule Take 10 mg by mouth daily.   No Known Allergies Recent Results (from the past 2160 hour(s))  POCT urinalysis dipstick     Status: Abnormal   Collection Time: 09/24/21  1:14 PM  Result Value Ref Range   Color, UA yellow yellow   Clarity, UA cloudy (A) clear   Glucose, UA negative negative mg/dL   Bilirubin, UA negative negative   Ketones, POC UA trace (5) (A) negative mg/dL   Spec Grav, UA 1.025 1.010 - 1.025   Blood, UA large (A) negative   pH, UA 7.0 5.0 - 8.0   Protein Ur, POC >=300 (A) negative mg/dL   Urobilinogen, UA 0.2 0.2 or 1.0 E.U./dL   Nitrite, UA Positive (A) Negative   Leukocytes, UA Large (3+) (A) Negative  Urine Culture     Status: Abnormal   Collection Time: 09/24/21  2:50 PM   Specimen: Urine, Clean Catch  Result Value Ref Range   Specimen Description URINE, CLEAN CATCH    Special Requests      NONE Performed at [facility], [address], [place], [place] [address]    Culture >=100,000 COLONIES/mL ESCHERICHIA COLI (A)    Report Status 09/27/2021 FINAL    Organism ID, Bacteria ESCHERICHIA COLI (A)       Susceptibility   Escherichia coli - MIC*    AMPICILLIN <=2 SENSITIVE Sensitive     CEFAZOLIN <=4 SENSITIVE Sensitive     CEFEPIME <=0.12 SENSITIVE Sensitive     CEFTRIAXONE <=0.25 SENSITIVE Sensitive     CIPROFLOXACIN <=0.25 SENSITIVE Sensitive     GENTAMICIN <=1 SENSITIVE Sensitive     IMIPENEM <=0.25 SENSITIVE Sensitive     NITROFURANTOIN <=16 SENSITIVE Sensitive     TRIMETH/SULFA <=20 SENSITIVE Sensitive     AMPICILLIN/SULBACTAM <=2 SENSITIVE Sensitive     PIP/TAZO <=4 SENSITIVE Sensitive     * >=100,000 COLONIES/mL ESCHERICHIA COLI   Objective  Body mass index is 30.7 kg/m. Wt Readings from Last 3 Encounters:  10/02/21 179 lb 9.6 oz (81.5 kg)  08/23/21 184 lb (83.5 kg)  03/20/21 181 lb (82.1 kg)   Temp Readings from Last 3  Encounters:  10/02/21 Marland Kitchen)  97 F (36.1 C) (Temporal)  09/24/21 98.8 F (37.1 C) (Oral)  02/15/20 97.6 F (36.4 C) (Temporal)   BP Readings from Last 3 Encounters:  10/02/21 122/84  09/24/21 (!) 133/92  02/15/20 114/82   Pulse Readings from Last 3 Encounters:  10/02/21 91  09/24/21 91  02/15/20 80    Physical Exam Vitals and nursing note reviewed.  Constitutional:      Appearance: Normal appearance. She is well-developed and well-groomed.  HENT:     Head: Normocephalic and atraumatic.  Eyes:     Conjunctiva/sclera: Conjunctivae normal.     Pupils: Pupils are equal, round, and reactive to light.  Cardiovascular:     Rate and Rhythm: Normal rate and regular rhythm.     Heart sounds: Normal heart sounds. No murmur heard. Pulmonary:     Effort: Pulmonary effort is normal.     Breath sounds: Normal breath sounds.  Abdominal:     General: Abdomen is flat. Bowel sounds are normal.     Tenderness: There is no abdominal tenderness.  Musculoskeletal:        General: No tenderness.  Skin:    General: Skin is warm and dry.  Neurological:     General: No focal deficit present.     Mental Status: She is alert and oriented to person, place, and time. Mental status is at baseline.     Cranial Nerves: Cranial nerves 2-12 are intact.     Gait: Gait is intact.  Psychiatric:        Attention and Perception: Attention and perception normal.        Mood and Affect: Mood and affect normal.        Speech: Speech normal.        Behavior: Behavior normal. Behavior is cooperative.        Thought Content: Thought content normal.        Cognition and Memory: Cognition and memory normal.        Judgment: Judgment normal.    Assessment  Plan  Annual physical exam Had flu and Tdap  rec mmr vaccine had 1/2 immune per labs 06/2019  covid 4/4 3 pfizer and 1 moderna   MRI/mammo 06/17/18 neg ordered by [facility] to f/u 05/2019 for h/o Hodgkins and will have EKG, mammo and breast MRI  -ordered  [facility]    Dr. Leonides Schanz refer Dr. Linda Hedges today   Colonoscopy age 89   Dermatology end summer 2022 normal exam no bx  Hep B immune  Healthy diet and exercise rec     Hypothyroidism, unspecified type - Plan: TSH On levo 100 mcg qd  Hypertension, controlled on losartan 25 mg qd- Plan: Comprehensive metabolic panel, Lipid panel, CBC with Differential/Platelet  H/O Hodgkin's lymphoma - Plan: Ambulatory referral to Hematology / Oncology survivorship clinic  Acute cystitis without hematuria - Plan: Urine Culture H/o kidney stones consider CT ab/pelvis and urology referral in the future hold for now  Encounter for surveillance of contraceptives, unspecified contraceptive - Plan: Ambulatory referral to Obstetrics / Gynecology  Yeast vaginitis - Plan: fluconazole (DIFLUCAN) 150 MG tablet Yeast infection prevention   Tendonitis - Plan: Ambulatory referral to Orthopedic Surgery Right wrist tendonitis - Plan: Ambulatory referral to Orthopedic Surgery Left ankle tendonitis - Plan: Ambulatory referral to Orthopedic Surgery   [facility] Dr. Lucila [facility] oncology  Psych Dr Nicolasa Ducking  Provider: Dr. Olivia Mackie McLean-Scocuzza-Internal Medicine

## 2021-10-02 NOTE — Patient Instructions (Addendum)
[facility] call for an appointment [phone]  Align/culturelle probiotics or yogurt Erik Obey, [place])   [facility]    [phone] Not available [address], [place], [place] [address]      Specialties     Obstetrics and Gynecology      Dr. Linda Hedges         Consider CT scan abdomen pelvis   Dr. Sherlene Shams urogyn-let me know  Phone Fax E-mail Address  [phone] Not available [address]   [place] [address]     Specialties     Urology and Obstetrics and Gynecology      04/13/2019 [place] [facility]   [place], [place] [address]   [phone]   System, Provider Not In     [place], Keturah Shavers, MD   [place], [place] [address]   [phone]   [phone] (Fax)   Post-therapeutic hypothyroidism (Primary Dx);  Personal history of radiation exposure;  Toxicity of radiation, late effect;  History of Hodgkin's lymphoma

## 2021-10-07 LAB — URINE CYTOLOGY ANCILLARY ONLY
Bacterial Vaginitis-Urine: NEGATIVE
Candida Urine: NEGATIVE
Chlamydia: NEGATIVE
Comment: NEGATIVE
Comment: NEGATIVE
Comment: NORMAL
Neisseria Gonorrhea: NEGATIVE
Trichomonas: NEGATIVE

## 2021-10-09 DIAGNOSIS — F411 Generalized anxiety disorder: Secondary | ICD-10-CM | POA: Diagnosis not present

## 2021-10-09 DIAGNOSIS — F332 Major depressive disorder, recurrent severe without psychotic features: Secondary | ICD-10-CM | POA: Diagnosis not present

## 2021-10-10 ENCOUNTER — Telehealth: Payer: Self-pay | Admitting: Internal Medicine

## 2021-10-10 NOTE — Telephone Encounter (Signed)
Did she know about appt call [facility] when ready for appt

## 2021-10-10 NOTE — Telephone Encounter (Signed)
Rejection Reason - Patient was No Show" Samantha Meyers said on Oct 09, 2021 4:03 PM   Pt appt was on 10/08/2021 3:20 pm  Msg from [facility]

## 2021-10-10 NOTE — Telephone Encounter (Signed)
Letter mailed to Patient to call their office to reschedule

## 2021-10-14 ENCOUNTER — Other Ambulatory Visit: Payer: BC Managed Care – PPO

## 2021-10-15 ENCOUNTER — Other Ambulatory Visit: Payer: BC Managed Care – PPO

## 2021-10-15 DIAGNOSIS — N3000 Acute cystitis without hematuria: Secondary | ICD-10-CM

## 2021-10-15 DIAGNOSIS — Z113 Encounter for screening for infections with a predominantly sexual mode of transmission: Secondary | ICD-10-CM

## 2021-10-16 LAB — URINE CULTURE
MICRO NUMBER:: 12780107
Result:: NO GROWTH
SPECIMEN QUALITY:: ADEQUATE

## 2021-10-22 ENCOUNTER — Other Ambulatory Visit (INDEPENDENT_AMBULATORY_CARE_PROVIDER_SITE_OTHER): Payer: BC Managed Care – PPO

## 2021-10-22 ENCOUNTER — Other Ambulatory Visit: Payer: Self-pay

## 2021-10-22 DIAGNOSIS — E559 Vitamin D deficiency, unspecified: Secondary | ICD-10-CM

## 2021-10-22 DIAGNOSIS — N3000 Acute cystitis without hematuria: Secondary | ICD-10-CM

## 2021-10-22 DIAGNOSIS — I10 Essential (primary) hypertension: Secondary | ICD-10-CM

## 2021-10-22 DIAGNOSIS — E039 Hypothyroidism, unspecified: Secondary | ICD-10-CM

## 2021-10-22 DIAGNOSIS — Z113 Encounter for screening for infections with a predominantly sexual mode of transmission: Secondary | ICD-10-CM | POA: Diagnosis not present

## 2021-10-22 DIAGNOSIS — E538 Deficiency of other specified B group vitamins: Secondary | ICD-10-CM | POA: Diagnosis not present

## 2021-10-23 LAB — CBC WITH DIFFERENTIAL/PLATELET
Basophils Absolute: 0 10*3/uL (ref 0.0–0.1)
Basophils Relative: 0.3 % (ref 0.0–3.0)
Eosinophils Absolute: 0 10*3/uL (ref 0.0–0.7)
Eosinophils Relative: 0.2 % (ref 0.0–5.0)
HCT: 40.6 % (ref 36.0–46.0)
Hemoglobin: 13.8 g/dL (ref 12.0–15.0)
Lymphocytes Relative: 22.2 % (ref 12.0–46.0)
Lymphs Abs: 1.9 10*3/uL (ref 0.7–4.0)
MCHC: 34 g/dL (ref 30.0–36.0)
MCV: 86.8 fl (ref 78.0–100.0)
Monocytes Absolute: 0.8 10*3/uL (ref 0.1–1.0)
Monocytes Relative: 9 % (ref 3.0–12.0)
Neutro Abs: 5.7 10*3/uL (ref 1.4–7.7)
Neutrophils Relative %: 68.3 % (ref 43.0–77.0)
Platelets: 286 10*3/uL (ref 150.0–400.0)
RBC: 4.68 Mil/uL (ref 3.87–5.11)
RDW: 13.1 % (ref 11.5–15.5)
WBC: 8.4 10*3/uL (ref 4.0–10.5)

## 2021-10-23 LAB — VITAMIN D 25 HYDROXY (VIT D DEFICIENCY, FRACTURES): VITD: 53.6 ng/mL (ref 30.00–100.00)

## 2021-10-23 LAB — COMPREHENSIVE METABOLIC PANEL
ALT: 21 U/L (ref 0–35)
AST: 21 U/L (ref 0–37)
Albumin: 4.2 g/dL (ref 3.5–5.2)
Alkaline Phosphatase: 54 U/L (ref 39–117)
BUN: 13 mg/dL (ref 6–23)
CO2: 24 mEq/L (ref 19–32)
Calcium: 9.2 mg/dL (ref 8.4–10.5)
Chloride: 105 mEq/L (ref 96–112)
Creatinine, Ser: 0.86 mg/dL (ref 0.40–1.20)
GFR: 83.73 mL/min (ref >60.00)
Glucose, Bld: 81 mg/dL (ref 70–99)
Potassium: 4.3 mEq/L (ref 3.5–5.1)
Sodium: 138 mEq/L (ref 135–145)
Total Bilirubin: 0.6 mg/dL (ref 0.2–1.2)
Total Protein: 6.8 g/dL (ref 6.0–8.3)

## 2021-10-23 LAB — VITAMIN B12: Vitamin B-12: 573 pg/mL (ref 211–911)

## 2021-10-23 LAB — HEPATITIS C ANTIBODY
Hepatitis C Ab: NONREACTIVE
SIGNAL TO CUT-OFF: 0.04 (ref <1.00)

## 2021-10-23 LAB — LIPID PANEL
Cholesterol: 162 mg/dL (ref 0–200)
HDL: 72.4 mg/dL (ref >39.00)
LDL Cholesterol: 66 mg/dL (ref 0–99)
NonHDL: 89.13
Total CHOL/HDL Ratio: 2
Triglycerides: 114 mg/dL (ref 0.0–149.0)
VLDL: 22.8 mg/dL (ref 0.0–40.0)

## 2021-10-23 LAB — HSV(HERPES SIMPLEX VRS) I + II AB-IGG
HAV 1 IGG,TYPE SPECIFIC AB: <0.9 index
HSV 2 IGG,TYPE SPECIFIC AB: <0.9 index

## 2021-10-23 LAB — TSH: TSH: 0.3 u[IU]/mL — ABNORMAL LOW (ref 0.35–5.50)

## 2021-10-23 LAB — HIV ANTIBODY (ROUTINE TESTING W REFLEX): HIV 1&2 Ab, 4th Generation: NONREACTIVE

## 2021-10-23 LAB — RPR: RPR Ser Ql: NONREACTIVE

## 2021-10-24 NOTE — Addendum Note (Signed)
Addended by: Nanci Pina on: 10/24/2021 02:12 PM   Modules accepted: Orders

## 2021-10-31 DIAGNOSIS — F411 Generalized anxiety disorder: Secondary | ICD-10-CM | POA: Diagnosis not present

## 2021-10-31 DIAGNOSIS — F330 Major depressive disorder, recurrent, mild: Secondary | ICD-10-CM | POA: Diagnosis not present

## 2021-10-31 DIAGNOSIS — F900 Attention-deficit hyperactivity disorder, predominantly inattentive type: Secondary | ICD-10-CM | POA: Diagnosis not present

## 2021-11-04 DIAGNOSIS — F411 Generalized anxiety disorder: Secondary | ICD-10-CM | POA: Diagnosis not present

## 2021-11-04 DIAGNOSIS — F332 Major depressive disorder, recurrent severe without psychotic features: Secondary | ICD-10-CM | POA: Diagnosis not present

## 2021-11-12 ENCOUNTER — Telehealth: Payer: Self-pay | Admitting: Internal Medicine

## 2021-11-12 DIAGNOSIS — R399 Unspecified symptoms and signs involving the genitourinary system: Secondary | ICD-10-CM

## 2021-11-12 NOTE — Telephone Encounter (Signed)
Pt called in regards to UTI she think has returned. Pt was seen at [facility] 11/29 for UTI. Pt believes it has returned. Pt is scheduled for a lab tomorrow 1/18 at 9:45. Pt is wanting to know if she can get a UA done tomorrow as well due to no appt availability.

## 2021-11-13 ENCOUNTER — Other Ambulatory Visit: Payer: Self-pay

## 2021-11-13 ENCOUNTER — Other Ambulatory Visit: Payer: Self-pay | Admitting: Internal Medicine

## 2021-11-13 ENCOUNTER — Ambulatory Visit
Admission: EM | Admit: 2021-11-13 | Discharge: 2021-11-13 | Disposition: A | Discharge status: Home / Self Care | Payer: BC Managed Care – PPO | Attending: Medical Oncology | Admitting: Medical Oncology

## 2021-11-13 ENCOUNTER — Other Ambulatory Visit (INDEPENDENT_AMBULATORY_CARE_PROVIDER_SITE_OTHER): Payer: BC Managed Care – PPO

## 2021-11-13 ENCOUNTER — Encounter: Payer: Self-pay | Admitting: Emergency Medicine

## 2021-11-13 DIAGNOSIS — N3001 Acute cystitis with hematuria: Secondary | ICD-10-CM | POA: Diagnosis not present

## 2021-11-13 DIAGNOSIS — N390 Urinary tract infection, site not specified: Secondary | ICD-10-CM

## 2021-11-13 DIAGNOSIS — E039 Hypothyroidism, unspecified: Secondary | ICD-10-CM

## 2021-11-13 LAB — POCT URINE PREGNANCY: Preg Test, Ur: NEGATIVE

## 2021-11-13 LAB — POCT URINALYSIS DIP (MANUAL ENTRY)
Bilirubin, UA: NEGATIVE
Glucose, UA: NEGATIVE mg/dL
Ketones, POC UA: NEGATIVE mg/dL
Nitrite, UA: POSITIVE — AB
Protein Ur, POC: >=300 mg/dL — AB
Spec Grav, UA: 1.02 (ref 1.010–1.025)
Urobilinogen, UA: 0.2 E.U./dL
pH, UA: 7 (ref 5.0–8.0)

## 2021-11-13 LAB — TSH: TSH: 0.24 u[IU]/mL — ABNORMAL LOW (ref 0.35–5.50)

## 2021-11-13 MED ORDER — CIPROFLOXACIN HCL 500 MG PO TABS: 500.0000 mg | ORAL_TABLET | Freq: Two times a day (BID) | ORAL | 0 refills | Status: DC

## 2021-11-13 MED ORDER — SULFAMETHOXAZOLE-TRIMETHOPRIM 800-160 MG PO TABS
1.0000 | ORAL_TABLET | Freq: Two times a day (BID) | ORAL | 0 refills | Status: AC
Start: 2021-11-13 — End: 2021-11-20

## 2021-11-13 MED ORDER — LEVOTHYROXINE SODIUM 88 MCG PO TABS: ORAL_TABLET | ORAL | 3 refills | Status: DC

## 2021-11-13 MED ORDER — CEFTRIAXONE SODIUM 1 G IJ SOLR
1.0000 g | Freq: Once | INTRAMUSCULAR | Status: AC
  Administered 2021-11-13: 1 g via INTRAMUSCULAR

## 2021-11-13 NOTE — Telephone Encounter (Signed)
See this and confirm this is what pt wants to do? Cancel urine orders

## 2021-11-13 NOTE — Addendum Note (Signed)
Addended by: Thressa Sheller on: 11/13/2021 11:32 AM   Modules accepted: Orders

## 2021-11-13 NOTE — Telephone Encounter (Signed)
Patient came in for lab work this morning and dropped off urine sample at that time. Spoke with Dr Olivia Mackie McLean-Scocuzza and she states okay to place urine orders. States to inform the Patient if the results are positive the Patient will need to make an appointment as we can not diagnose and treat outside of appointments.   Orders placed

## 2021-11-13 NOTE — Addendum Note (Signed)
Addended by: Thressa Sheller on: 11/13/2021 10:22 AM   Modules accepted: Orders

## 2021-11-13 NOTE — Telephone Encounter (Signed)
Patient spoke to front desk and can see in chart that she had checked in with urgent care. Orders were already cancelled

## 2021-11-13 NOTE — Telephone Encounter (Signed)
For your information, Patient at urgent care

## 2021-11-13 NOTE — ED Provider Notes (Addendum)
Samantha Meyers    CSN: [id number] Arrival date & time: 11/13/21  1141      History   Chief Complaint Chief Complaint  Patient presents with   Dysuria   Flank Pain   Nausea    HPI Samantha Meyers is a 41 y.o. female.   HPI  Dysuria: Patient presents with bilateral flank pain, dysuria, urine odor, nausea for the past 4 days. Patient has had 2 other UTIs within the last 3 months. She was treated with Cefdinir and Keflex for those infections. Cultures grew out E.Coli which was pansensitive. She reports no recent hospitalization/procedure, fever, vomiting or significant abdominal pain. She has tried hydration with water for symptoms.  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   Annual physical exam 10/12/2019   Toxicity of radiation, 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   Wrist pain 09/28/2018   Keratosis pilaris 02/12/2018   H/O Hodgkin's lymphoma 01/21/2018   Seasonal allergies 01/21/2018   ADHD (attention deficit hyperactivity disorder), 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      Home Medications    Prior to Admission medications   Medication Sig Start Date End Date  Taking? Authorizing Provider  cholecalciferol (VITAMIN D3) 25 MCG (1000 UNIT) tablet Take 1,000 Units by mouth daily.   Yes [provider]  levothyroxine (SYNTHROID) 100 MCG tablet TAKE 1 TABLET BY MOUTH DAILY. TAKE IN MORNING ON EMPTY STOMACH ONE HOUR BEFORE EATING 10/02/21  Yes McLean-Scocuzza, Nino Glow, MD  losartan (COZAAR) 25 MG tablet Take 1 tablet (25 mg total) by mouth daily. 10/02/21  Yes McLean-Scocuzza, Nino Glow, MD  traZODone (DESYREL) 50 MG tablet Take 50 mg by mouth at bedtime as needed. 02/27/21  Yes [provider]  venlafaxine XR (EFFEXOR-XR) 150 MG 24 hr capsule Take 150 mg by mouth daily. 02/19/21  Yes [provider]  VYVANSE 10 MG capsule Take 10 mg by mouth daily. 08/16/21  Yes [provider]  albuterol (PROAIR HFA) 108 (90 Base) MCG/ACT inhaler Inhale 1-2 puffs into the lungs every 4 (four) hours as needed for wheezing or shortness of breath. Patient not taking: Reported on 10/02/2021 03/22/21   McLean-Scocuzza, Nino Glow, MD  amphetamine-dextroamphetamine (ADDERALL XR) 20 MG 24 hr capsule Take 1 capsule (20 mg total) by mouth daily. Rx 1/3 fill Q30 days Patient not taking: Reported on 10/02/2021 02/15/20   McLean-Scocuzza, Nino Glow, MD  ARIPiprazole (ABILIFY) 5 MG tablet Take by mouth every morning. 09/30/21   [provider]    Family History Family  History  Problem Relation Age of Onset   Depression Mother        postpartum   Hyperlipidemia Mother    Cancer Father        lung in remission small cell cancer former smoker    Depression Father    Diabetes Father    Hearing loss Father    Hypertension Father    Depression Sister    ADD / ADHD Sister    Kidney disease Brother    ADD / ADHD Brother    Diabetes Son        DM 1    Cancer Maternal Grandmother        breast    Early death Paternal Grandmother    Stroke Paternal Grandmother    Eating disorder Sister     Social History Social History   Tobacco Use   Smoking  status: Never   Smokeless tobacco: Never  Vaping Use   Vaping Use: Never used  Substance Use Topics   Alcohol use: Yes    Comment: occas.   Drug use: No     Allergies   Patient has no known allergies.   Review of Systems Review of Systems  As stated above in HPI Physical Exam Triage Vital Signs ED Triage Vitals  Enc Vitals Group     BP 11/13/21 1159 (!) 174/80     Pulse Rate 11/13/21 1159 (!) 102     Resp 11/13/21 1159 18     Temp 11/13/21 1159 97.7 F (36.5 C)     Temp Source 11/13/21 1159 Oral     SpO2 11/13/21 1159 96 %     Weight --      Height --      Head Circumference --      Peak Flow --      Pain Score 11/13/21 1154 4     Pain Loc --      Pain Edu? --      Excl. in [place]? --    No data found.  Updated Vital Signs BP (!) 174/80 (BP Location: Left Arm)    Pulse (!) 102    Temp 97.7 F (36.5 C) (Oral)    Resp 18    LMP 10/29/2021    SpO2 96%   Physical Exam Vitals and nursing note reviewed.  Constitutional:      General: She is not in acute distress.    Appearance: Normal appearance. She is not ill-appearing, toxic-appearing or diaphoretic.  HENT:     Head: Normocephalic and atraumatic.  Cardiovascular:     Rate and Rhythm: Normal rate and regular rhythm.     Heart sounds: Normal heart sounds.  Pulmonary:     Effort: Pulmonary effort is normal.     Breath sounds: Normal breath sounds.  Abdominal:     General: Bowel sounds are normal. There is no distension.     Palpations: Abdomen is soft. There is no mass.     Tenderness: There is no abdominal tenderness. There is right CVA tenderness and left CVA tenderness. There is no guarding or rebound.     Hernia: No hernia is present.  Musculoskeletal:     Cervical back: Neck supple.  Lymphadenopathy:     Cervical: No cervical adenopathy.  Skin:    General: Skin is warm.  Neurological:     Mental Status: She is alert and oriented to person, place, and time.     UC Treatments / Results  Labs (all  labs ordered are listed, but only abnormal results are displayed) Labs Reviewed  POCT URINALYSIS DIP (MANUAL ENTRY) - Abnormal; Notable for the following components:      Result Value   Clarity, UA cloudy (*)    Blood, UA moderate (*)    Protein Ur, POC >=300 (*)    Nitrite, UA Positive (*)    Leukocytes, UA Moderate (2+) (*)    All other components within normal limits  POCT URINE PREGNANCY    EKG   Radiology No results found.  Procedures Procedures (including critical care time)  Medications Ordered in UC Medications - No data to display  Initial Impression / Assessment and Plan / UC Course  I have reviewed the triage vital signs and the nursing notes.  Pertinent labs & imaging results that were available during my care of the patient were reviewed by me and considered in my medical decision making (see chart for details).     New. Culture pending. Appears to be a complex UTI vs developing pyelonephritis. Dicussed my concerns for a complicated UTI with patient.  At this time she strongly opts for outpatient therapy however we did discuss strict red flag signs and symptoms that would warrant her going to the emergency room. Originally was going to treat with Cipro however after discussion of risks/benefits with her history of having "weak tendons" per patient we will switch our plan to Bactrim and rocephin. Discussed how to use along with precautions. She will stay hydrated with water. Follow up with [place] or her PCP within the next few days to ensure she is improving.  Final Clinical Impressions(s) / UC Diagnoses   Final diagnoses:  None   Discharge Instructions   None    ED Prescriptions   None    PDMP not reviewed this encounter.   Hughie Closs, Hershal Coria 11/13/21 1217    Hughie Closs, PA-C 11/13/21 [place], PA-C 11/13/21 1230

## 2021-11-13 NOTE — ED Triage Notes (Signed)
Pt presents with nausea, flank pain, dysuria, and urine odor x 4 days.

## 2021-11-13 NOTE — Telephone Encounter (Signed)
Pt is going to go to UC to receive medication for her UTI. Pt states she is in excruciating pain. Pt would like to discard the order for the sample she left today. Pt is going to UC at [facility] through [facility] so that we can access her records from there. No available appts at [facility] however pt was placed on the walk in list.

## 2021-11-16 LAB — URINE CULTURE: Culture: >=100000 — AB

## 2021-11-18 DIAGNOSIS — F411 Generalized anxiety disorder: Secondary | ICD-10-CM | POA: Diagnosis not present

## 2021-11-18 DIAGNOSIS — F332 Major depressive disorder, recurrent severe without psychotic features: Secondary | ICD-10-CM | POA: Diagnosis not present

## 2021-11-19 ENCOUNTER — Telehealth: Payer: Self-pay

## 2021-11-19 DIAGNOSIS — E039 Hypothyroidism, unspecified: Secondary | ICD-10-CM

## 2021-11-19 NOTE — Telephone Encounter (Signed)
Lab ordered for repeat lab test.

## 2021-12-11 DIAGNOSIS — F411 Generalized anxiety disorder: Secondary | ICD-10-CM | POA: Diagnosis not present

## 2021-12-11 DIAGNOSIS — F332 Major depressive disorder, recurrent severe without psychotic features: Secondary | ICD-10-CM | POA: Diagnosis not present

## 2021-12-18 DIAGNOSIS — F411 Generalized anxiety disorder: Secondary | ICD-10-CM | POA: Diagnosis not present

## 2021-12-18 DIAGNOSIS — F332 Major depressive disorder, recurrent severe without psychotic features: Secondary | ICD-10-CM | POA: Diagnosis not present

## 2021-12-25 DIAGNOSIS — F411 Generalized anxiety disorder: Secondary | ICD-10-CM | POA: Diagnosis not present

## 2021-12-25 DIAGNOSIS — F332 Major depressive disorder, recurrent severe without psychotic features: Secondary | ICD-10-CM | POA: Diagnosis not present

## 2021-12-26 DIAGNOSIS — Z124 Encounter for screening for malignant neoplasm of cervix: Secondary | ICD-10-CM | POA: Diagnosis not present

## 2021-12-26 DIAGNOSIS — Z113 Encounter for screening for infections with a predominantly sexual mode of transmission: Secondary | ICD-10-CM | POA: Diagnosis not present

## 2021-12-26 DIAGNOSIS — Z01419 Encounter for gynecological examination (general) (routine) without abnormal findings: Secondary | ICD-10-CM | POA: Diagnosis not present

## 2021-12-26 DIAGNOSIS — Z1151 Encounter for screening for human papillomavirus (HPV): Secondary | ICD-10-CM | POA: Diagnosis not present

## 2021-12-26 DIAGNOSIS — Z6830 Body mass index (BMI) 30.0-30.9, adult: Secondary | ICD-10-CM | POA: Diagnosis not present

## 2021-12-26 DIAGNOSIS — F419 Anxiety disorder, unspecified: Secondary | ICD-10-CM | POA: Insufficient documentation

## 2021-12-27 DIAGNOSIS — F900 Attention-deficit hyperactivity disorder, predominantly inattentive type: Secondary | ICD-10-CM | POA: Diagnosis not present

## 2021-12-27 DIAGNOSIS — F411 Generalized anxiety disorder: Secondary | ICD-10-CM | POA: Diagnosis not present

## 2021-12-27 DIAGNOSIS — F330 Major depressive disorder, recurrent, mild: Secondary | ICD-10-CM | POA: Diagnosis not present

## 2021-12-31 DIAGNOSIS — S93401A Sprain of unspecified ligament of right ankle, initial encounter: Secondary | ICD-10-CM | POA: Diagnosis not present

## 2021-12-31 DIAGNOSIS — M25571 Pain in right ankle and joints of right foot: Secondary | ICD-10-CM | POA: Diagnosis not present

## 2021-12-31 HISTORY — DX: Sprain of unspecified ligament of right ankle, initial encounter: S93.401A

## 2022-01-01 DIAGNOSIS — F332 Major depressive disorder, recurrent severe without psychotic features: Secondary | ICD-10-CM | POA: Diagnosis not present

## 2022-01-01 DIAGNOSIS — F411 Generalized anxiety disorder: Secondary | ICD-10-CM | POA: Diagnosis not present

## 2022-01-07 ENCOUNTER — Other Ambulatory Visit (INDEPENDENT_AMBULATORY_CARE_PROVIDER_SITE_OTHER): Payer: BC Managed Care – PPO

## 2022-01-07 ENCOUNTER — Other Ambulatory Visit: Payer: Self-pay

## 2022-01-07 DIAGNOSIS — E039 Hypothyroidism, unspecified: Secondary | ICD-10-CM

## 2022-01-07 LAB — TSH: TSH: 0.81 u[IU]/mL (ref 0.35–5.50)

## 2022-01-08 DIAGNOSIS — F332 Major depressive disorder, recurrent severe without psychotic features: Secondary | ICD-10-CM | POA: Diagnosis not present

## 2022-01-08 DIAGNOSIS — F411 Generalized anxiety disorder: Secondary | ICD-10-CM | POA: Diagnosis not present

## 2022-01-09 DIAGNOSIS — S93401A Sprain of unspecified ligament of right ankle, initial encounter: Secondary | ICD-10-CM | POA: Diagnosis not present

## 2022-01-09 DIAGNOSIS — M25571 Pain in right ankle and joints of right foot: Secondary | ICD-10-CM | POA: Diagnosis not present

## 2022-01-13 DIAGNOSIS — F411 Generalized anxiety disorder: Secondary | ICD-10-CM | POA: Diagnosis not present

## 2022-01-13 DIAGNOSIS — F332 Major depressive disorder, recurrent severe without psychotic features: Secondary | ICD-10-CM | POA: Diagnosis not present

## 2022-01-15 ENCOUNTER — Telehealth (INDEPENDENT_AMBULATORY_CARE_PROVIDER_SITE_OTHER): Payer: BC Managed Care – PPO | Admitting: Internal Medicine

## 2022-01-15 ENCOUNTER — Encounter: Payer: Self-pay | Admitting: Internal Medicine

## 2022-01-15 ENCOUNTER — Other Ambulatory Visit: Payer: Self-pay

## 2022-01-15 VITALS — Ht 64.13 in | Wt 173.0 lb

## 2022-01-15 DIAGNOSIS — J0110 Acute frontal sinusitis, unspecified: Secondary | ICD-10-CM | POA: Diagnosis not present

## 2022-01-15 DIAGNOSIS — B3731 Acute candidiasis of vulva and vagina: Secondary | ICD-10-CM | POA: Diagnosis not present

## 2022-01-15 MED ORDER — FLUCONAZOLE 150 MG PO TABS: 150.0000 mg | ORAL_TABLET | Freq: Once | ORAL | 0 refills | Status: AC

## 2022-01-15 MED ORDER — AMOXICILLIN-POT CLAVULANATE 875-125 MG PO TABS: 1.0000 | ORAL_TABLET | Freq: Two times a day (BID) | ORAL | 0 refills | Status: DC

## 2022-01-15 NOTE — Progress Notes (Signed)
Virtual Visit via Video Note ? ?I connected with Samantha Meyers  ? on 01/15/22 at 10:40 AM EDT by a video enabled telemedicine application and verified that I am speaking with the correct person using two identifiers. ? Location patient: [place] ?Location provider:work or home office ?Persons participating in the virtual visit: patient, provider ? ?I discussed the limitations and requested verbal permission for telemedicine visit. The patient expressed understanding and agreed to proceed. ? ? ?HPI: ? ?Acute telemedicine visit for : ?Sick sinus pressure/pain since 1 or 11/2021 with left sided facial pain fatigue worse over the last 3 days has green discharge tried ns, claritin, flonase no fever negative covid kids just got over stomach bug  ? ?-Pertinent past medical history: see below ?-Pertinent medication allergies:No Known Allergies ?-COVID-19 vaccine status:  ?Immunization History  ?Administered Date(s) Administered  ? Influenza,inj,Quad PF,6+ Mos 07/07/2019  ? Influenza-Unspecified 08/18/2017, 07/27/2018, 08/14/2020, 09/11/2021  ? Moderna SARS-COV2 Booster Vaccination 09/30/2020  ? PFIZER(Purple Top)SARS-COV-2 Vaccination 12/29/2019, 01/26/2020  ? Pension scheme manager 10yr & up 09/11/2021  ? Tdap 06/21/2013  ? ? ? ?ROS: See pertinent positives and negatives per HPI. ? ?Past Medical History:  ?Diagnosis Date  ? Anxiety   ? Cancer ([facility]   ? COVID-19   ? 03/22/21  ? Depression   ? Hodgkin's lymphoma ([place]   ? dx'ed 2005 tx'ed with chemo/radiation  ? Hypertension   ? Kidney stones   ? Thyroid disease   ? UTI (urinary tract infection)   ? recurrent  ? ? ?Past Surgical History:  ?Procedure Laterality Date  ? KIDNEY SURGERY    ? 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 PORT-A-CATH REMOVAL    ? 2005   ? TONSILLECTOMY    ? 1987  ? ? ? ?Current Outpatient Medications:  ?  albuterol (PROAIR HFA) 108 (90 Base) MCG/ACT inhaler, Inhale 1-2 puffs into the lungs every 4 (four) hours as needed for  wheezing or shortness of breath., Disp: 18 g, Rfl: 11 ?  amoxicillin-clavulanate (AUGMENTIN) 875-125 MG tablet, Take 1 tablet by mouth 2 (two) times daily. With food, Disp: 14 tablet, Rfl: 0 ?  cholecalciferol (VITAMIN D3) 25 MCG (1000 UNIT) tablet, Take 1,000 Units by mouth daily., Disp: , Rfl:  ?  fluconazole (DIFLUCAN) 150 MG tablet, Take 1 tablet (150 mg total) by mouth once for 1 dose. Repeat dose in 3 days, Disp: 2 tablet, Rfl: 0 ?  levothyroxine (SYNTHROID) 88 MCG tablet, TAKE 1 TABLET BY MOUTH DAILY. TAKE IN MORNING ON EMPTY STOMACH ONE HOUR BEFORE EATING d/c 100 mcg dose, Disp: 90 tablet, Rfl: 3 ?  losartan (COZAAR) 25 MG tablet, Take 1 tablet (25 mg total) by mouth daily., Disp: 90 tablet, Rfl: 3 ?  traZODone (DESYREL) 50 MG tablet, Take 50 mg by mouth at bedtime as needed., Disp: , Rfl:  ?  venlafaxine XR (EFFEXOR-XR) 150 MG 24 hr capsule, Take 150 mg by mouth daily., Disp: , Rfl:  ?  VYVANSE 10 MG capsule, Take 10 mg by mouth daily., Disp: , Rfl:  ?  amphetamine-dextroamphetamine (ADDERALL XR) 20 MG 24 hr capsule, Take 1 capsule (20 mg total) by mouth daily. Rx 1/3 fill Q30 days (Patient not taking: Reported on 01/15/2022), Disp: 30 capsule, Rfl: 0 ?  ARIPiprazole (ABILIFY) 5 MG tablet, Take by mouth every morning. (Patient not taking: Reported on 01/15/2022), Disp: , Rfl:  ? ?EXAM: ? ?VITALS per patient if applicable: ? ?GENERAL: alert, oriented, appears well and  in no acute distress ? ?HEENT: atraumatic, conjunttiva clear, no obvious abnormalities on inspection of external nose and ears ? ?NECK: normal movements of the head and neck ? ?LUNGS: on inspection no signs of respiratory distress, breathing rate appears normal, no obvious gross SOB, gasping or wheezing ? ?CV: no obvious cyanosis ? ?MS: moves all visible extremities without noticeable abnormality ? ?PSYCH/NEURO: pleasant and cooperative, no obvious depression or anxiety, speech and thought processing grossly intact ? ?ASSESSMENT AND  PLAN: ? ?Discussed the following assessment and plan: ? ?Acute non-recurrent frontal sinusitis - Plan: amoxicillin-clavulanate (AUGMENTIN) 875-125 MG tablet bid x 7 days ? ?Yeast vaginitis - Plan: fluconazole (DIFLUCAN) 150 MG tablet ? ?-we discussed possible serious and likely etiologies, options for evaluation and workup, limitations of telemedicine visit vs in person visit, treatment, treatment risks and precautions. Pt is agreeable to treatment via telemedicine at this moment.  ?  ?I discussed the assessment and treatment plan with the patient. The patient was provided an opportunity to ask questions and all were answered. The patient agreed with the plan and demonstrated an understanding of the instructions. ?  ? ?Time spent 5-10 min ?Nino Glow McLean-Scocuzza, MD   ?

## 2022-01-22 DIAGNOSIS — F411 Generalized anxiety disorder: Secondary | ICD-10-CM | POA: Diagnosis not present

## 2022-01-22 DIAGNOSIS — F332 Major depressive disorder, recurrent severe without psychotic features: Secondary | ICD-10-CM | POA: Diagnosis not present

## 2022-01-29 ENCOUNTER — Encounter: Payer: Self-pay | Admitting: Internal Medicine

## 2022-01-29 DIAGNOSIS — F411 Generalized anxiety disorder: Secondary | ICD-10-CM | POA: Diagnosis not present

## 2022-01-29 DIAGNOSIS — F332 Major depressive disorder, recurrent severe without psychotic features: Secondary | ICD-10-CM | POA: Diagnosis not present

## 2022-02-03 DIAGNOSIS — M25571 Pain in right ankle and joints of right foot: Secondary | ICD-10-CM | POA: Diagnosis not present

## 2022-02-03 DIAGNOSIS — F411 Generalized anxiety disorder: Secondary | ICD-10-CM | POA: Diagnosis not present

## 2022-02-03 DIAGNOSIS — F332 Major depressive disorder, recurrent severe without psychotic features: Secondary | ICD-10-CM | POA: Diagnosis not present

## 2022-02-19 DIAGNOSIS — M25571 Pain in right ankle and joints of right foot: Secondary | ICD-10-CM | POA: Diagnosis not present

## 2022-02-19 DIAGNOSIS — F332 Major depressive disorder, recurrent severe without psychotic features: Secondary | ICD-10-CM | POA: Diagnosis not present

## 2022-02-19 DIAGNOSIS — F411 Generalized anxiety disorder: Secondary | ICD-10-CM | POA: Diagnosis not present

## 2022-02-25 DIAGNOSIS — M25571 Pain in right ankle and joints of right foot: Secondary | ICD-10-CM | POA: Diagnosis not present

## 2022-02-26 DIAGNOSIS — F332 Major depressive disorder, recurrent severe without psychotic features: Secondary | ICD-10-CM | POA: Diagnosis not present

## 2022-02-26 DIAGNOSIS — F411 Generalized anxiety disorder: Secondary | ICD-10-CM | POA: Diagnosis not present

## 2022-03-05 DIAGNOSIS — M25571 Pain in right ankle and joints of right foot: Secondary | ICD-10-CM | POA: Diagnosis not present

## 2022-03-12 DIAGNOSIS — F332 Major depressive disorder, recurrent severe without psychotic features: Secondary | ICD-10-CM | POA: Diagnosis not present

## 2022-03-12 DIAGNOSIS — F411 Generalized anxiety disorder: Secondary | ICD-10-CM | POA: Diagnosis not present

## 2022-03-18 DIAGNOSIS — M25571 Pain in right ankle and joints of right foot: Secondary | ICD-10-CM | POA: Diagnosis not present

## 2022-03-20 DIAGNOSIS — F4322 Adjustment disorder with anxiety: Secondary | ICD-10-CM | POA: Diagnosis not present

## 2022-03-20 DIAGNOSIS — F332 Major depressive disorder, recurrent severe without psychotic features: Secondary | ICD-10-CM | POA: Diagnosis not present

## 2022-03-20 DIAGNOSIS — F411 Generalized anxiety disorder: Secondary | ICD-10-CM | POA: Diagnosis not present

## 2022-03-25 DIAGNOSIS — F4322 Adjustment disorder with anxiety: Secondary | ICD-10-CM | POA: Diagnosis not present

## 2022-03-27 DIAGNOSIS — F330 Major depressive disorder, recurrent, mild: Secondary | ICD-10-CM | POA: Diagnosis not present

## 2022-03-27 DIAGNOSIS — F900 Attention-deficit hyperactivity disorder, predominantly inattentive type: Secondary | ICD-10-CM | POA: Diagnosis not present

## 2022-03-27 DIAGNOSIS — F411 Generalized anxiety disorder: Secondary | ICD-10-CM | POA: Diagnosis not present

## 2022-04-02 DIAGNOSIS — F411 Generalized anxiety disorder: Secondary | ICD-10-CM | POA: Diagnosis not present

## 2022-04-02 DIAGNOSIS — F4322 Adjustment disorder with anxiety: Secondary | ICD-10-CM | POA: Diagnosis not present

## 2022-04-02 DIAGNOSIS — F332 Major depressive disorder, recurrent severe without psychotic features: Secondary | ICD-10-CM | POA: Diagnosis not present

## 2022-04-03 ENCOUNTER — Ambulatory Visit: Payer: BC Managed Care – PPO | Admitting: Internal Medicine

## 2022-04-08 ENCOUNTER — Ambulatory Visit: Payer: BC Managed Care – PPO | Admitting: Internal Medicine

## 2022-04-09 DIAGNOSIS — F332 Major depressive disorder, recurrent severe without psychotic features: Secondary | ICD-10-CM | POA: Diagnosis not present

## 2022-04-09 DIAGNOSIS — F411 Generalized anxiety disorder: Secondary | ICD-10-CM | POA: Diagnosis not present

## 2022-04-11 DIAGNOSIS — F4322 Adjustment disorder with anxiety: Secondary | ICD-10-CM | POA: Diagnosis not present

## 2022-04-16 DIAGNOSIS — F411 Generalized anxiety disorder: Secondary | ICD-10-CM | POA: Diagnosis not present

## 2022-04-16 DIAGNOSIS — F332 Major depressive disorder, recurrent severe without psychotic features: Secondary | ICD-10-CM | POA: Diagnosis not present

## 2022-04-30 DIAGNOSIS — F332 Major depressive disorder, recurrent severe without psychotic features: Secondary | ICD-10-CM | POA: Diagnosis not present

## 2022-04-30 DIAGNOSIS — F411 Generalized anxiety disorder: Secondary | ICD-10-CM | POA: Diagnosis not present

## 2022-05-02 DIAGNOSIS — F4322 Adjustment disorder with anxiety: Secondary | ICD-10-CM | POA: Diagnosis not present

## 2022-05-04 ENCOUNTER — Other Ambulatory Visit: Payer: Self-pay

## 2022-05-04 DIAGNOSIS — I10 Essential (primary) hypertension: Secondary | ICD-10-CM | POA: Insufficient documentation

## 2022-05-04 DIAGNOSIS — R109 Unspecified abdominal pain: Secondary | ICD-10-CM | POA: Diagnosis not present

## 2022-05-04 DIAGNOSIS — N12 Tubulo-interstitial nephritis, not specified as acute or chronic: Secondary | ICD-10-CM | POA: Insufficient documentation

## 2022-05-04 DIAGNOSIS — D72829 Elevated white blood cell count, unspecified: Secondary | ICD-10-CM | POA: Diagnosis not present

## 2022-05-04 NOTE — ED Triage Notes (Signed)
Pt with urinary frequency and burning since last evening, along with left flank pain but also c/o some pain in right flank. Pt alert, NAD at this time. Denies blood in urine.

## 2022-05-05 ENCOUNTER — Emergency Department
Admission: EM | Admit: 2022-05-05 | Discharge: 2022-05-05 | Disposition: A | Discharge status: Home / Self Care | Payer: BC Managed Care – PPO | Attending: Emergency Medicine | Admitting: Emergency Medicine

## 2022-05-05 DIAGNOSIS — N12 Tubulo-interstitial nephritis, not specified as acute or chronic: Secondary | ICD-10-CM

## 2022-05-05 LAB — BASIC METABOLIC PANEL
Anion gap: 7 (ref 5–15)
BUN: 16 mg/dL (ref 6–20)
CO2: 26 mmol/L (ref 22–32)
Calcium: 9.2 mg/dL (ref 8.9–10.3)
Chloride: 104 mmol/L (ref 98–111)
Creatinine, Ser: 0.93 mg/dL (ref 0.44–1.00)
GFR, Estimated: >60 mL/min (ref >60)
Glucose, Bld: 98 mg/dL (ref 70–99)
Potassium: 4.2 mmol/L (ref 3.5–5.1)
Sodium: 137 mmol/L (ref 135–145)

## 2022-05-05 LAB — URINALYSIS, ROUTINE W REFLEX MICROSCOPIC
Bilirubin Urine: NEGATIVE
Glucose, UA: NEGATIVE mg/dL
Ketones, ur: NEGATIVE mg/dL
Nitrite: NEGATIVE
Protein, ur: 30 mg/dL — AB
Specific Gravity, Urine: <1.005 — ABNORMAL LOW (ref 1.005–1.030)
pH: 5 (ref 5.0–8.0)

## 2022-05-05 LAB — URINALYSIS, MICROSCOPIC (REFLEX): WBC, UA: >50 WBC/hpf (ref 0–5)

## 2022-05-05 LAB — CBC
HCT: 41.3 % (ref 36.0–46.0)
Hemoglobin: 13.5 g/dL (ref 12.0–15.0)
MCH: 29.2 pg (ref 26.0–34.0)
MCHC: 32.7 g/dL (ref 30.0–36.0)
MCV: 89.4 fL (ref 80.0–100.0)
Platelets: 364 10*3/uL (ref 150–400)
RBC: 4.62 MIL/uL (ref 3.87–5.11)
RDW: 12.4 % (ref 11.5–15.5)
WBC: 12.3 10*3/uL — ABNORMAL HIGH (ref 4.0–10.5)
nRBC: 0 % (ref 0.0–0.2)

## 2022-05-05 LAB — PREGNANCY, URINE: Preg Test, Ur: NEGATIVE

## 2022-05-05 MED ORDER — SODIUM CHLORIDE 0.9 % IV SOLN
1.0000 g | INTRAVENOUS | Status: AC
  Administered 2022-05-05: 1 g via INTRAVENOUS
  Filled 2022-05-05: qty 10

## 2022-05-05 MED ORDER — KETOROLAC TROMETHAMINE 30 MG/ML IJ SOLN
15.0000 mg | Freq: Once | INTRAMUSCULAR | Status: AC
  Administered 2022-05-05: 15 mg via INTRAVENOUS
  Filled 2022-05-05: qty 1

## 2022-05-05 MED ORDER — CEFADROXIL 500 MG PO CAPS: 1000.0000 mg | ORAL_CAPSULE | Freq: Two times a day (BID) | ORAL | 0 refills | Status: AC

## 2022-05-05 NOTE — Discharge Instructions (Addendum)
Your workup today suggests that you have a urinary tract infection (UTI) which likely has spread to your kidneys.  Please take your antibiotic as prescribed and over-the-counter pain medication (Tylenol or Motrin) as needed, but no more than recommended on the label instructions.  Drink PLENTY of fluids.  Call your regular doctor to schedule the next available appointment to follow up on today's ED visit, or return immediately to the ED if your pain worsens, you have decreased urine production, develop fever, persistent vomiting, or other symptoms that concern you.

## 2022-05-05 NOTE — ED Provider Notes (Signed)
[facility] Provider Note    Event Date/Time   First MD Initiated Contact with Patient 05/05/22 0023     (approximate)   History   Flank Pain   HPI  Samantha Meyers is a 41 y.o. 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  She presents tonight with about 24 hours of pain in her left flank in the setting of a couple of days of pain when she urinates.  It feels similar to prior episodes of urinary tract infection.  She has had some nausea but no vomiting.  She denies fever.  She has not seen any blood in her urine.  She has had no recent trauma.  She has no chest pain or shortness of breath.       Physical Exam   Triage Vital Signs: ED Triage Vitals  Enc Vitals Group     BP 05/04/22 2344 (!) 153/103     Pulse Rate 05/04/22 2344 89     Resp 05/04/22 2344 15     Temp 05/04/22 2344 97.9 F (36.6 C)     Temp Source 05/04/22 2344 Oral     SpO2 05/04/22 2344 100 %     Weight 05/04/22 2345 80.3 kg (177 lb)     Height 05/04/22 2345 1.6 m ('5\' 3"'$ )     Head Circumference --      Peak Flow --      Pain Score 05/04/22 2345 6     Pain Loc --      Pain Edu? --      Excl. in [place]? --     Most recent vital signs: Vitals:   05/04/22 2344 05/05/22 0138  BP: (!) 153/103 (!) 135/96  Pulse: 89 76  Resp: 15 16  Temp: 97.9 F (36.6 C)   SpO2: 100% 100%     General: Awake, no distress though she appears uncomfortable. CV:  Good peripheral perfusion.  Regular rate and rhythm. Resp:  Normal effort.  Abd:  No distention.  No tenderness to palpation of the abdomen.  Bilateral tenderness to percussion of the flanks, more profound on the left. Other:  No focal neurological deficits, alert and oriented, appropriate mentation.   ED Results / Procedures / Treatments   Labs (all labs ordered are listed, but only abnormal results are  displayed) Labs Reviewed  URINALYSIS, ROUTINE W REFLEX MICROSCOPIC - Abnormal; Notable for the following components:      Result Value   Specific Gravity, Urine <1.005 (*)    Hgb urine dipstick MODERATE (*)    Protein, ur 30 (*)    Leukocytes,Ua MODERATE (*)    All other components within normal limits  CBC - Abnormal; Notable for the following components:   WBC 12.3 (*)    All other components within normal limits  URINALYSIS, MICROSCOPIC (REFLEX) - Abnormal; Notable for the following components:   Bacteria, UA FEW (*)    All other components within normal limits  URINE CULTURE  BASIC METABOLIC PANEL  PREGNANCY, URINE     RADIOLOGY Patient declined CT scans    PROCEDURES:  Critical Care performed: No  Procedures   MEDICATIONS ORDERED IN ED: Medications  cefTRIAXone (ROCEPHIN) 1 g in sodium chloride 0.9 % 100 mL IVPB (0 g Intravenous Stopped 05/05/22 0130)  ketorolac (TORADOL) 30 MG/ML injection 15 mg (15 mg Intravenous Given 05/05/22 0054)  IMPRESSION / MDM / ASSESSMENT AND PLAN / ED COURSE  I reviewed the triage vital signs and the nursing notes.                              Differential diagnosis includes, but is not limited to, UTI/pyelonephritis, ureteral obstruction such as stone or stricture, metabolic or electrolyte abnormality, or acute kidney injury/renal failure, renal infarction.  Patient's presentation is most consistent with acute presentation with potential threat to life or bodily function.  Patient's vital signs are generally reassuring with no tachycardia, only some slight hypertension.  Labs ordered initially include CBC, urine pregnancy test, BMP, urinalysis with reflex microscopy, urine culture.  Labs notable for mild leukocytosis of 12.3, normal basic metabolic panel, and urinalysis suggestive of infection with moderate leukocytes, moderate hemoglobinuria, few bacteria, greater than 50 WBCs on microscopy.  Urine culture is pending.  Given  the patient's pain that rapidly progressed from dysuria to her flank, specifically her left side, I am most concerned about pyelonephritis.  However she is generally well-appearing with stable vitals and only mild leukocytosis.  She most likely can be managed as an outpatient.  That is her strong preference.  However, I also explained that I would like to obtain a CT scan to evaluate not only for radiographic evidence of pyelonephritis, but also to evaluate for any sign of obstruction such as a stone.  The patient very much does not want to obtain any imaging tonight.  I explained the reasons why, including that an infected, obstructing stone could lead to much worse illness, and she understands but would like to simply treat the infection.  I think this is reasonable but I gave her strict precautions should she get worse.  I ordered ceftriaxone 1 g IV to begin treatment and will discharge her home on an oral cephalosporin.  I reviewed the medical record and see that she had a similar urgent care visit on November 13, 2021, and she was treated empirically with ceftriaxone and Bactrim.  However, I explained to her that will send her home with a cephalosporin based on our infectious disease recommendations And she agreed.  For her discomfort I also ordered Toradol 15 mg IV.  She drove herself so she would not be a good candidate for narcotics.    Clinical Course as of 05/05/22 0228  Mon May 05, 2022  0201 Antibiotics have finished.  Patient reports that she feels much better.  She still does not want to proceed with a CT scan.  I gave strict return precautions should she develop new or worsening symptoms.  She understands and agrees with the plan.  Discharging with a course of cefadroxil to follow-up on the ceftriaxone 1 g IV she has already received. [CF]    Clinical Course User Index [CF] Hinda Kehr, MD     FINAL CLINICAL IMPRESSION(S) / ED DIAGNOSES   Final diagnoses:  Pyelonephritis      Rx / DC Orders   ED Discharge Orders          Ordered    cefadroxil (DURICEF) 500 MG capsule  2 times daily        05/05/22 0203             Note:  This document was prepared using Dragon voice recognition software and may include unintentional dictation errors.   Hinda Kehr, MD 05/05/22 [phone]

## 2022-05-07 DIAGNOSIS — F411 Generalized anxiety disorder: Secondary | ICD-10-CM | POA: Diagnosis not present

## 2022-05-07 DIAGNOSIS — F332 Major depressive disorder, recurrent severe without psychotic features: Secondary | ICD-10-CM | POA: Diagnosis not present

## 2022-05-07 LAB — URINE CULTURE: Culture: >=100000 — AB

## 2022-05-09 ENCOUNTER — Encounter: Payer: Self-pay | Admitting: Internal Medicine

## 2022-05-09 ENCOUNTER — Ambulatory Visit
Admission: RE | Admit: 2022-05-09 | Discharge: 2022-05-09 | Disposition: A | Discharge status: Home / Self Care | Payer: BC Managed Care – PPO | Source: Ambulatory Visit | Attending: Internal Medicine | Admitting: Internal Medicine

## 2022-05-09 ENCOUNTER — Ambulatory Visit: Payer: BC Managed Care – PPO | Admitting: Internal Medicine

## 2022-05-09 VITALS — BP 120/80 | HR 73 | Temp 97.4°F | Resp 14 | Ht 63.0 in | Wt 182.2 lb

## 2022-05-09 DIAGNOSIS — R109 Unspecified abdominal pain: Secondary | ICD-10-CM

## 2022-05-09 DIAGNOSIS — N3000 Acute cystitis without hematuria: Secondary | ICD-10-CM | POA: Insufficient documentation

## 2022-05-09 DIAGNOSIS — F4322 Adjustment disorder with anxiety: Secondary | ICD-10-CM | POA: Diagnosis not present

## 2022-05-09 DIAGNOSIS — N390 Urinary tract infection, site not specified: Secondary | ICD-10-CM

## 2022-05-09 DIAGNOSIS — Z87442 Personal history of urinary calculi: Secondary | ICD-10-CM | POA: Insufficient documentation

## 2022-05-09 DIAGNOSIS — R1084 Generalized abdominal pain: Secondary | ICD-10-CM

## 2022-05-09 MED ORDER — IOHEXOL 300 MG/ML  SOLN
100.0000 mL | Freq: Once | INTRAMUSCULAR | Status: AC | PRN
  Administered 2022-05-09: 100 mL via INTRAVENOUS

## 2022-05-09 MED ORDER — TRAMADOL HCL 50 MG PO TABS: 50.0000 mg | ORAL_TABLET | Freq: Two times a day (BID) | ORAL | 0 refills | Status: AC | PRN

## 2022-05-09 MED ORDER — KETOROLAC TROMETHAMINE 60 MG/2ML IM SOLN
60.0000 mg | Freq: Once | INTRAMUSCULAR | Status: AC
  Administered 2022-05-09: 30 mg via INTRAMUSCULAR

## 2022-05-09 NOTE — Addendum Note (Signed)
Addended by: Everrett Coombe C on: 05/09/2022 09:07 AM   Modules accepted: Orders

## 2022-05-09 NOTE — Patient Instructions (Addendum)
[facility] Dr. Freida Busman, Dr. Jeffie Pollock  4.8 [phone],711 Google reviews Medical clinic in [place], [place] Address: [address], [address], [place] [address] Hours:  Open ? Closes 5?PM Phone: [phone]   Urinary Tract Infection, Adult  A urinary tract infection (UTI) is an infection of any part of the urinary tract. The urinary tract includes the kidneys, ureters, bladder, and urethra. These organs make, store, and get rid of urine in the body. An upper UTI affects the ureters and kidneys. A lower UTI affects the bladder and urethra. What are the causes? Most urinary tract infections are caused by bacteria in your genital area around your urethra, where urine leaves your body. These bacteria grow and cause inflammation of your urinary tract. What increases the risk? You are more likely to develop this condition if: You have a urinary catheter that stays in place. You are not able to control when you urinate or have a bowel movement (incontinence). You are female and you: Use a spermicide or diaphragm for birth control. Have low estrogen levels. Are pregnant. You have certain genes that increase your risk. You are sexually active. You take antibiotic medicines. You have a condition that causes your flow of urine to slow down, such as: An enlarged prostate, if you are female. Blockage in your urethra. A kidney stone. A nerve condition that affects your bladder control (neurogenic bladder). Not getting enough to drink, or not urinating often. You have certain medical conditions, such as: Diabetes. A weak disease-fighting system (immunesystem). Sickle cell disease. Gout. Spinal cord injury. What are the signs or symptoms? Symptoms of this condition include: Needing to urinate right away (urgency). Frequent urination. This may include small amounts of urine each time you urinate. Pain or burning with urination. Blood in the urine. Urine that smells bad or  unusual. Trouble urinating. Cloudy urine. Vaginal discharge, if you are female. Pain in the abdomen or the lower back. You may also have: Vomiting or a decreased appetite. Confusion. Irritability or tiredness. A fever or chills. Diarrhea. The first symptom in older adults may be confusion. In some cases, they may not have any symptoms until the infection has worsened. How is this diagnosed? This condition is diagnosed based on your medical history and a physical exam. You may also have other tests, including: Urine tests. Blood tests. Tests for STIs (sexually transmitted infections). If you have had more than one UTI, a cystoscopy or imaging studies may be done to determine the cause of the infections. How is this treated? Treatment for this condition includes: Antibiotic medicine. Over-the-counter medicines to treat discomfort. Drinking enough water to stay hydrated. If you have frequent infections or have other conditions such as a kidney stone, you may need to see a health care provider who specializes in the urinary tract (urologist). In rare cases, urinary tract infections can cause sepsis. Sepsis is a life-threatening condition that occurs when the body responds to an infection. Sepsis is treated in the hospital with IV antibiotics, fluids, and other medicines. Follow these instructions at home:  Medicines Take over-the-counter and prescription medicines only as told by your health care provider. If you were prescribed an antibiotic medicine, take it as told by your health care provider. Do not stop using the antibiotic even if you start to feel better. General instructions Make sure you: Empty your bladder often and completely. Do not hold urine for long periods of time. Empty your bladder after sex. Wipe from front to back after  urinating or having a bowel movement if you are female. Use each tissue only one time when you wipe. Drink enough fluid to keep your urine pale  yellow. Keep all follow-up visits. This is important. Contact a health care provider if: Your symptoms do not get better after 1-2 days. Your symptoms go away and then return. Get help right away if: You have severe pain in your back or your lower abdomen. You have a fever or chills. You have nausea or vomiting. Summary A urinary tract infection (UTI) is an infection of any part of the urinary tract, which includes the kidneys, ureters, bladder, and urethra. Most urinary tract infections are caused by bacteria in your genital area. Treatment for this condition often includes antibiotic medicines. If you were prescribed an antibiotic medicine, take it as told by your health care provider. Do not stop using the antibiotic even if you start to feel better. Keep all follow-up visits. This is important. This information is not intended to replace advice given to you by your health care provider. Make sure you discuss any questions you have with your health care provider. Document Revised: 05/25/2020 Document Reviewed: 05/25/2020 Elsevier Patient Education  [place].

## 2022-05-09 NOTE — Progress Notes (Addendum)
Chief Complaint  Patient presents with   Hospitalization Follow-up    [facility] 7/10 for flank pain, urine labs showed uti. Was given Duricef 500 mg x2 daily x10 days. Med helping some still c/o R flank pain & uti sx's   F/u  1. Right flank pain, RLQ pain 05/02/22 and went to ED 05/05/22 had RLQ ab pain given abx 500 mg bid today 2-3/10 no n/v had E Coli ut 05/04/22 pansensitive  And h/o slow urinary flow on the right, recurrent uti as well and kidney stones     Review of Systems  Constitutional:  Negative for weight loss.  HENT:  Negative for hearing loss.   Eyes:  Negative for blurred vision.  Respiratory:  Negative for shortness of breath.   Cardiovascular:  Negative for chest pain.  Gastrointestinal:  Positive for abdominal pain. Negative for blood in stool, nausea and vomiting.  Genitourinary:  Negative for dysuria.  Musculoskeletal:  Negative for falls and joint pain.  Skin:  Negative for rash.  Neurological:  Negative for headaches.  Psychiatric/Behavioral:  Negative for depression.    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   PORT-A-CATH REMOVAL     2005    TONSILLECTOMY     1987   Family History  Problem Relation Age of Onset   Depression Mother        postpartum   Hyperlipidemia Mother    Cancer Father        lung in remission small cell cancer former smoker    Depression Father    Diabetes Father    Hearing loss Father    Hypertension Father    Depression Sister    ADD / ADHD Sister    Kidney disease Brother    ADD / ADHD Brother    Diabetes Son        DM 1    Cancer Maternal  Grandmother        breast    Early death Paternal Grandmother    Stroke Paternal Grandmother    Eating disorder Sister    Social History   Socioeconomic History   Marital status: Married    Spouse name: Not on file   Number of children: Not on file   Years of education: Not on file   Highest education level: Not on file  Occupational History   Not on file  Tobacco Use   Smoking status: Never   Smokeless tobacco: Never  Vaping Use   Vaping Use: Never used  Substance and Sexual Activity   Alcohol use: Yes    Comment: occas.   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/3 kids has DM 1 (3 sons)   Married husband travels a lot as of 10/02/21 separated from husband of 81 years   Vegetarian, wears seat belt, feels safe in relationship  Social Determinants of Health   Financial Resource Strain: Not on file  Food Insecurity: Not on file  Transportation Needs: Not on file  Physical Activity: Not on file  Stress: Not on file  Social Connections: Not on file  Intimate Partner Violence: Not on file   Current Meds  Medication Sig   albuterol (PROAIR HFA) 108 (90 Base) MCG/ACT inhaler Inhale 1-2 puffs into the lungs every 4 (four) hours as needed for wheezing or shortness of breath.   cefadroxil (DURICEF) 500 MG capsule Take 2 capsules (1,000 mg total) by mouth 2 (two) times daily for 10 days.   cholecalciferol (VITAMIN D3) 25 MCG (1000 UNIT) tablet Take 1,000 Units by mouth daily.   levothyroxine (SYNTHROID) 88 MCG tablet TAKE 1 TABLET BY MOUTH DAILY. TAKE IN MORNING ON EMPTY STOMACH ONE HOUR BEFORE EATING d/c 100 mcg dose   losartan (COZAAR) 25 MG tablet Take 1 tablet (25 mg total) by mouth daily.   traMADol (ULTRAM) 50 MG tablet Take 1 tablet (50 mg total) by mouth every 12 (twelve) hours as needed for up to 5 days.   traZODone (DESYREL) 50 MG tablet Take 50 mg by mouth at bedtime as  needed.   venlafaxine XR (EFFEXOR-XR) 150 MG 24 hr capsule Take 150 mg by mouth daily.   VYVANSE 10 MG capsule Take 10 mg by mouth daily.   No Known Allergies Recent Results (from the past 2160 hour(s))  Urinalysis, Routine w reflex microscopic Urine, Clean Catch     Status: Abnormal   Collection Time: 05/04/22 11:49 PM  Result Value Ref Range   Color, Urine YELLOW YELLOW   APPearance CLEAR CLEAR   Specific Gravity, Urine <1.005 (L) 1.005 - 1.030   pH 5.0 5.0 - 8.0   Glucose, UA NEGATIVE NEGATIVE mg/dL   Hgb urine dipstick MODERATE (A) NEGATIVE   Bilirubin Urine NEGATIVE NEGATIVE   Ketones, ur NEGATIVE NEGATIVE mg/dL   Protein, ur 30 (A) NEGATIVE mg/dL   Nitrite NEGATIVE NEGATIVE   Leukocytes,Ua MODERATE (A) NEGATIVE    Comment: Performed at [facility], [address], [place], [place] [address]  Basic metabolic panel     Status: None   Collection Time: 05/04/22 11:49 PM  Result Value Ref Range   Sodium 137 135 - 145 mmol/L   Potassium 4.2 3.5 - 5.1 mmol/L   Chloride 104 98 - 111 mmol/L   CO2 26 22 - 32 mmol/L   Glucose, Bld 98 70 - 99 mg/dL    Comment: Glucose reference range applies only to samples taken after fasting for at least 8 hours.   BUN 16 6 - 20 mg/dL   Creatinine, Ser 0.93 0.44 - 1.00 mg/dL   Calcium 9.2 8.9 - 10.3 mg/dL   GFR, Estimated >60 >60 mL/min    Comment: (NOTE) Calculated using the CKD-EPI Creatinine Equation (2021)    Anion gap 7 5 - 15    Comment: Performed at [facility], [address] [place], [place] [address]  CBC     Status: Abnormal   Collection Time: 05/04/22 11:49 PM  Result Value Ref Range   WBC 12.3 (H) 4.0 - 10.5 K/uL   RBC 4.62 3.87 - 5.11 MIL/uL   Hemoglobin 13.5 12.0 - 15.0 g/dL   HCT 41.3 36.0 - 46.0 %   MCV 89.4 80.0 - 100.0 fL   MCH 29.2 26.0 - 34.0 pg   MCHC 32.7 30.0 - 36.0 g/dL   RDW 12.4 11.5 - 15.5 %  Platelets 364 150 - 400 K/uL   nRBC 0.0 0.0 - 0.2 %    Comment: Performed at [facility], [address] [place], [place] [address]  Pregnancy, urine     Status: None   Collection Time: 05/04/22 11:49 PM  Result Value Ref Range   Preg Test, Ur NEGATIVE NEGATIVE    Comment: Performed at [facility], [address] [place], [place] [address]  Urine Culture     Status: Abnormal   Collection Time: 05/04/22 11:49 PM   Specimen: Urine, Clean Catch  Result Value Ref Range   Specimen Description      URINE, CLEAN CATCH Performed at [facility], [address], [place], [place] [address]    Special Requests      NONE Performed at [facility], [address] [place], [place] [address]    Culture >=100,000 COLONIES/mL ESCHERICHIA COLI (A)    Report Status 05/07/2022 FINAL    Organism ID, Bacteria ESCHERICHIA COLI (A)       Susceptibility   Escherichia coli - MIC*    AMPICILLIN <=2 SENSITIVE Sensitive     CEFAZOLIN <=4 SENSITIVE Sensitive     CEFEPIME <=0.12 SENSITIVE Sensitive     CEFTRIAXONE <=0.25 SENSITIVE Sensitive     CIPROFLOXACIN <=0.25 SENSITIVE Sensitive     GENTAMICIN <=1 SENSITIVE Sensitive     IMIPENEM <=0.25 SENSITIVE Sensitive     NITROFURANTOIN <=16 SENSITIVE Sensitive     TRIMETH/SULFA <=20 SENSITIVE Sensitive     AMPICILLIN/SULBACTAM <=2 SENSITIVE Sensitive     PIP/TAZO <=4 SENSITIVE Sensitive     * >=100,000 COLONIES/mL ESCHERICHIA COLI  Urinalysis, Microscopic (reflex)     Status: Abnormal   Collection Time: 05/04/22 11:49 PM  Result Value Ref Range   RBC / HPF 6-10 0 - 5 RBC/hpf   WBC, UA >50 0 - 5 WBC/hpf   Bacteria, UA FEW (A) NONE SEEN   Squamous Epithelial / LPF 0-5 0 - 5    Comment: Performed at [facility], [address] [place], [place] [address]   Objective  Body mass index is 32.28 kg/m. Wt Readings from Last 3 Encounters:  05/09/22 182 lb 3.2 oz (82.6 kg)  05/04/22 177 lb (80.3 kg)  01/15/22 173 lb (78.5 kg)   Temp Readings from Last 3 Encounters:  05/09/22 (!) 97.4 F  (36.3 C) (Oral)  05/04/22 97.9 F (36.6 C) (Oral)  11/13/21 97.7 F (36.5 C) (Oral)   BP Readings from Last 3 Encounters:  05/09/22 120/80  05/05/22 (!) 135/96  11/13/21 (!) 150/108   Pulse Readings from Last 3 Encounters:  05/09/22 73  05/05/22 76  11/13/21 (!) 102    Physical Exam Vitals and nursing note reviewed.  Constitutional:      Appearance: Normal appearance. She is well-developed and well-groomed.  HENT:     Head: Normocephalic and atraumatic.  Eyes:     Conjunctiva/sclera: Conjunctivae normal.     Pupils: Pupils are equal, round, and reactive to light.  Cardiovascular:     Rate and Rhythm: Normal rate and regular rhythm.     Heart sounds: Normal heart sounds. No murmur heard. Pulmonary:     Effort: Pulmonary effort is normal.     Breath sounds: Normal breath sounds.  Abdominal:     General: Abdomen is flat. Bowel sounds are normal.     Tenderness: There is abdominal tenderness in the right lower quadrant. There is right CVA tenderness.  Musculoskeletal:  General: No tenderness.  Skin:    General: Skin is warm and dry.  Neurological:     General: No focal deficit present.     Mental Status: She is alert and oriented to person, place, and time. Mental status is at baseline.     Cranial Nerves: Cranial nerves 2-12 are intact.     Motor: Motor function is intact.     Coordination: Coordination is intact.     Gait: Gait is intact.  Psychiatric:        Attention and Perception: Attention and perception normal.        Mood and Affect: Mood and affect normal.        Speech: Speech normal.        Behavior: Behavior normal. Behavior is cooperative.        Thought Content: Thought content normal.        Cognition and Memory: Cognition and memory normal.        Judgment: Judgment normal.     Assessment  Plan  Right flank pain - Plan: Urinalysis, Routine w reflex microscopic, Urine Culture, Ambulatory referral to Urology, CT ABDOMEN PELVIS W WO  CONTRAST Toradol 30 x 1   Acute cystitis without hematuria - Plan: Urinalysis, Routine w reflex microscopic, Urine Culture, Ambulatory referral to Urology, CT ABDOMEN PELVIS W WO CONTRAST  Recurrent UTI - Plan: Ambulatory referral to Urology, CT ABDOMEN PELVIS W WO CONTRAST  History of kidney stones - Plan: Ambulatory referral to Urology, CT ABDOMEN PELVIS W WO CONTRAST  Generalized abdominal pain - Plan: CT ABDOMEN PELVIS W WO CONTRAST   HM  See last visit   Provider: Dr. Olivia Mackie McLean-Scocuzza-Internal Medicine

## 2022-05-10 LAB — URINALYSIS, ROUTINE W REFLEX MICROSCOPIC
Bilirubin Urine: NEGATIVE
Glucose, UA: NEGATIVE
Hgb urine dipstick: NEGATIVE
Ketones, ur: NEGATIVE
Leukocytes,Ua: NEGATIVE
Nitrite: NEGATIVE
Protein, ur: NEGATIVE
Specific Gravity, Urine: 1.014 (ref 1.001–1.035)
pH: 5.5 (ref 5.0–8.0)

## 2022-05-10 LAB — URINE CULTURE
MICRO NUMBER:: 13648674
Result:: NO GROWTH
SPECIMEN QUALITY:: ADEQUATE

## 2022-05-13 ENCOUNTER — Encounter: Payer: Self-pay | Admitting: Internal Medicine

## 2022-05-16 DIAGNOSIS — F4322 Adjustment disorder with anxiety: Secondary | ICD-10-CM | POA: Diagnosis not present

## 2022-05-21 DIAGNOSIS — F900 Attention-deficit hyperactivity disorder, predominantly inattentive type: Secondary | ICD-10-CM | POA: Diagnosis not present

## 2022-05-21 DIAGNOSIS — F330 Major depressive disorder, recurrent, mild: Secondary | ICD-10-CM | POA: Diagnosis not present

## 2022-05-21 DIAGNOSIS — F411 Generalized anxiety disorder: Secondary | ICD-10-CM | POA: Diagnosis not present

## 2022-05-22 ENCOUNTER — Other Ambulatory Visit: Payer: Self-pay | Admitting: Internal Medicine

## 2022-05-22 ENCOUNTER — Encounter: Payer: Self-pay | Admitting: Internal Medicine

## 2022-05-22 DIAGNOSIS — N6325 Unspecified lump in the left breast, overlapping quadrants: Secondary | ICD-10-CM | POA: Diagnosis not present

## 2022-05-22 DIAGNOSIS — B3731 Acute candidiasis of vulva and vagina: Secondary | ICD-10-CM

## 2022-05-22 DIAGNOSIS — Z1231 Encounter for screening mammogram for malignant neoplasm of breast: Secondary | ICD-10-CM | POA: Diagnosis not present

## 2022-05-22 DIAGNOSIS — N6315 Unspecified lump in the right breast, overlapping quadrants: Secondary | ICD-10-CM | POA: Diagnosis not present

## 2022-05-22 DIAGNOSIS — Z1239 Encounter for other screening for malignant neoplasm of breast: Secondary | ICD-10-CM | POA: Diagnosis not present

## 2022-05-22 LAB — HM MAMMOGRAPHY

## 2022-05-22 MED ORDER — FLUCONAZOLE 150 MG PO TABS: 150.0000 mg | ORAL_TABLET | Freq: Once | ORAL | 0 refills | Status: AC

## 2022-05-23 ENCOUNTER — Encounter: Payer: Self-pay | Admitting: Internal Medicine

## 2022-05-23 DIAGNOSIS — F4322 Adjustment disorder with anxiety: Secondary | ICD-10-CM | POA: Diagnosis not present

## 2022-05-28 DIAGNOSIS — F411 Generalized anxiety disorder: Secondary | ICD-10-CM | POA: Diagnosis not present

## 2022-05-28 DIAGNOSIS — F332 Major depressive disorder, recurrent severe without psychotic features: Secondary | ICD-10-CM | POA: Diagnosis not present

## 2022-05-30 DIAGNOSIS — F4322 Adjustment disorder with anxiety: Secondary | ICD-10-CM | POA: Diagnosis not present

## 2022-06-02 DIAGNOSIS — N135 Crossing vessel and stricture of ureter without hydronephrosis: Secondary | ICD-10-CM | POA: Diagnosis not present

## 2022-06-03 DIAGNOSIS — F4322 Adjustment disorder with anxiety: Secondary | ICD-10-CM | POA: Diagnosis not present

## 2022-06-04 DIAGNOSIS — F332 Major depressive disorder, recurrent severe without psychotic features: Secondary | ICD-10-CM | POA: Diagnosis not present

## 2022-06-04 DIAGNOSIS — F411 Generalized anxiety disorder: Secondary | ICD-10-CM | POA: Diagnosis not present

## 2022-06-09 ENCOUNTER — Other Ambulatory Visit: Payer: Self-pay | Admitting: Urology

## 2022-06-10 ENCOUNTER — Other Ambulatory Visit (HOSPITAL_COMMUNITY): Payer: Self-pay | Admitting: Urology

## 2022-06-10 DIAGNOSIS — N135 Crossing vessel and stricture of ureter without hydronephrosis: Secondary | ICD-10-CM

## 2022-06-13 DIAGNOSIS — F4322 Adjustment disorder with anxiety: Secondary | ICD-10-CM | POA: Diagnosis not present

## 2022-06-17 ENCOUNTER — Encounter (HOSPITAL_BASED_OUTPATIENT_CLINIC_OR_DEPARTMENT_OTHER): Payer: Self-pay | Admitting: Urology

## 2022-06-18 ENCOUNTER — Other Ambulatory Visit: Payer: Self-pay

## 2022-06-18 ENCOUNTER — Encounter (HOSPITAL_BASED_OUTPATIENT_CLINIC_OR_DEPARTMENT_OTHER): Payer: Self-pay | Admitting: Urology

## 2022-06-18 NOTE — Progress Notes (Signed)
Spoke w/ via phone for pre-op interview---pt Lab needs dos----     ekg, I stat, urine preg          Lab results------ COVID test -----patient states asymptomatic no test needed Arrive at -------1030 am 06-27-2022 NPO after MN NO Solid Food.  Clear liquids from MN until---930 am Med rec completed Medications to take morning of surgery -----albuterol inhaler prn, levothyroxine, venlafaxine Diabetic medication -----n/a Patient instructed no nail polish to be worn day of surgery Patient instructed to bring photo id and insurance card day of surgery Patient aware to have Driver (ride ) / caregiver    Ovid Curd debleau friend cell [phone] for 24 hours after surgery  Patient Special Instructions -----none Pre-Op special Istructions -----none Patient verbalized understanding of instructions that were given at this phone interview. Patient denies shortness of breath, chest pain, fever, cough at this phone interview.   Echo 06-30-2018 [facility] oncology dr Garlan Fair 04-13-2019  epic (has appointment scheduled with dr Avanell Shackleton 06-25-2022) history of hodgkin's lymphoma

## 2022-06-20 ENCOUNTER — Encounter (HOSPITAL_COMMUNITY)
Admission: RE | Admit: 2022-06-20 | Discharge: 2022-06-20 | Disposition: A | Discharge status: Home / Self Care | Payer: BC Managed Care – PPO | Source: Ambulatory Visit | Attending: Urology | Admitting: Urology

## 2022-06-20 DIAGNOSIS — N135 Crossing vessel and stricture of ureter without hydronephrosis: Secondary | ICD-10-CM

## 2022-06-20 DIAGNOSIS — N271 Small kidney, bilateral: Secondary | ICD-10-CM | POA: Diagnosis not present

## 2022-06-20 DIAGNOSIS — Q632 Ectopic kidney: Secondary | ICD-10-CM | POA: Diagnosis not present

## 2022-06-20 DIAGNOSIS — N2881 Hypertrophy of kidney: Secondary | ICD-10-CM | POA: Diagnosis not present

## 2022-06-20 DIAGNOSIS — Z87442 Personal history of urinary calculi: Secondary | ICD-10-CM | POA: Diagnosis not present

## 2022-06-20 MED ORDER — FUROSEMIDE 10 MG/ML IJ SOLN: 40.0000 mg | Freq: Once | INTRAMUSCULAR | Status: AC

## 2022-06-20 MED ORDER — FUROSEMIDE 10 MG/ML IJ SOLN
INTRAMUSCULAR | Status: AC
  Administered 2022-06-20: 40 mg via INTRAVENOUS
  Filled 2022-06-20: qty 4

## 2022-06-20 MED ORDER — TECHNETIUM TC 99M MERTIATIDE
4.8000 | Freq: Once | INTRAVENOUS | Status: AC
  Administered 2022-06-20: 4.8 via INTRAVENOUS

## 2022-06-25 DIAGNOSIS — Z923 Personal history of irradiation: Secondary | ICD-10-CM | POA: Diagnosis not present

## 2022-06-25 DIAGNOSIS — F988 Other specified behavioral and emotional disorders with onset usually occurring in childhood and adolescence: Secondary | ICD-10-CM | POA: Diagnosis not present

## 2022-06-25 DIAGNOSIS — T66XXXS Radiation sickness, unspecified, sequela: Secondary | ICD-10-CM | POA: Diagnosis not present

## 2022-06-25 DIAGNOSIS — E890 Postprocedural hypothyroidism: Secondary | ICD-10-CM | POA: Diagnosis not present

## 2022-06-25 DIAGNOSIS — Z8571 Personal history of Hodgkin lymphoma: Secondary | ICD-10-CM | POA: Diagnosis not present

## 2022-06-25 DIAGNOSIS — Z08 Encounter for follow-up examination after completed treatment for malignant neoplasm: Secondary | ICD-10-CM | POA: Diagnosis not present

## 2022-06-25 DIAGNOSIS — I10 Essential (primary) hypertension: Secondary | ICD-10-CM | POA: Diagnosis not present

## 2022-06-26 NOTE — Anesthesia Preprocedure Evaluation (Addendum)
Anesthesia Evaluation  Patient identified by MRN, date of birth, ID band Patient awake    Reviewed: Allergy & Precautions, NPO status , Patient's Chart, lab work & pertinent test results  History of Anesthesia Complications (+) PONV and history of anesthetic complications  Airway Mallampati: II  TM Distance: >3 FB Neck ROM: Full    Dental no notable dental hx. (+) Teeth Intact, Dental Advisory Given   Pulmonary asthma ,    Pulmonary exam normal breath sounds clear to auscultation       Cardiovascular hypertension, Normal cardiovascular exam Rhythm:Regular Rate:Normal     Neuro/Psych Anxiety    GI/Hepatic negative GI ROS, Neg liver ROS,   Endo/Other  Hypothyroidism   Renal/GU      Musculoskeletal negative musculoskeletal ROS (+)   Abdominal   Peds  Hematology Lab Results      Component                Value               Date                      WBC                      12.3 (H)            05/04/2022                HGB                      13.5                05/04/2022                HCT                      41.3                05/04/2022                MCV                      89.4                05/04/2022                PLT                      364                 05/04/2022            Hx of Hodgekins Lymphoma   Anesthesia Other Findings   Reproductive/Obstetrics negative OB ROS                            Anesthesia Physical Anesthesia Plan  ASA: 2  Anesthesia Plan: General   Post-op Pain Management: Minimal or no pain anticipated   Induction: Intravenous  PONV Risk Score and Plan: 4 or greater and Treatment may vary due to age or medical condition, Scopolamine patch - Pre-op, Ondansetron, Dexamethasone, Midazolam and TIVA  Airway Management Planned: LMA  Additional Equipment: None  Intra-op Plan:   Post-operative Plan:   Informed Consent: I have reviewed the patients  History and Physical, chart, labs and discussed the procedure including the risks, benefits and  alternatives for the proposed anesthesia with the patient or authorized representative who has indicated his/her understanding and acceptance.     Dental advisory given  Plan Discussed with:   Anesthesia Plan Comments: (TIVA GA )       Anesthesia Quick Evaluation

## 2022-06-27 ENCOUNTER — Ambulatory Visit (HOSPITAL_BASED_OUTPATIENT_CLINIC_OR_DEPARTMENT_OTHER): Payer: BC Managed Care – PPO | Admitting: Anesthesiology

## 2022-06-27 ENCOUNTER — Encounter (HOSPITAL_BASED_OUTPATIENT_CLINIC_OR_DEPARTMENT_OTHER)
Admission: RE | Disposition: A | Discharge status: Home / Self Care | Payer: Self-pay | Source: Home / Self Care | Attending: Urology

## 2022-06-27 ENCOUNTER — Encounter (HOSPITAL_BASED_OUTPATIENT_CLINIC_OR_DEPARTMENT_OTHER): Payer: Self-pay | Admitting: Urology

## 2022-06-27 ENCOUNTER — Ambulatory Visit (HOSPITAL_BASED_OUTPATIENT_CLINIC_OR_DEPARTMENT_OTHER)
Admission: RE | Admit: 2022-06-27 | Discharge: 2022-06-27 | Disposition: A | Discharge status: Home / Self Care | Payer: BC Managed Care – PPO | Attending: Urology | Admitting: Urology

## 2022-06-27 ENCOUNTER — Other Ambulatory Visit: Payer: Self-pay

## 2022-06-27 DIAGNOSIS — Z87440 Personal history of urinary (tract) infections: Secondary | ICD-10-CM | POA: Diagnosis not present

## 2022-06-27 DIAGNOSIS — J45909 Unspecified asthma, uncomplicated: Secondary | ICD-10-CM | POA: Diagnosis not present

## 2022-06-27 DIAGNOSIS — Z01818 Encounter for other preprocedural examination: Secondary | ICD-10-CM

## 2022-06-27 DIAGNOSIS — N200 Calculus of kidney: Secondary | ICD-10-CM | POA: Insufficient documentation

## 2022-06-27 DIAGNOSIS — I10 Essential (primary) hypertension: Secondary | ICD-10-CM | POA: Diagnosis not present

## 2022-06-27 DIAGNOSIS — Z87442 Personal history of urinary calculi: Secondary | ICD-10-CM | POA: Insufficient documentation

## 2022-06-27 DIAGNOSIS — E039 Hypothyroidism, unspecified: Secondary | ICD-10-CM | POA: Insufficient documentation

## 2022-06-27 DIAGNOSIS — Z8572 Personal history of non-Hodgkin lymphomas: Secondary | ICD-10-CM | POA: Diagnosis not present

## 2022-06-27 DIAGNOSIS — N270 Small kidney, unilateral: Secondary | ICD-10-CM | POA: Diagnosis not present

## 2022-06-27 DIAGNOSIS — N135 Crossing vessel and stricture of ureter without hydronephrosis: Secondary | ICD-10-CM | POA: Diagnosis not present

## 2022-06-27 HISTORY — PX: CYSTOSCOPY W/ URETERAL STENT PLACEMENT: SHX1429

## 2022-06-27 HISTORY — DX: Personal history of urinary calculi: Z87.442

## 2022-06-27 HISTORY — DX: Unspecified asthma, uncomplicated: J45.909

## 2022-06-27 HISTORY — DX: Presence of spectacles and contact lenses: Z97.3

## 2022-06-27 HISTORY — DX: Nausea with vomiting, unspecified: R11.2

## 2022-06-27 HISTORY — DX: Attention-deficit hyperactivity disorder, unspecified type: F90.9

## 2022-06-27 HISTORY — DX: Other specified postprocedural states: Z98.890

## 2022-06-27 LAB — POCT I-STAT, CHEM 8
BUN: 9 mg/dL (ref 6–20)
Calcium, Ion: 1.3 mmol/L (ref 1.15–1.40)
Chloride: 101 mmol/L (ref 98–111)
Creatinine, Ser: 0.8 mg/dL (ref 0.44–1.00)
Glucose, Bld: 91 mg/dL (ref 70–99)
HCT: 40 % (ref 36.0–46.0)
Hemoglobin: 13.6 g/dL (ref 12.0–15.0)
Potassium: 4.1 mmol/L (ref 3.5–5.1)
Sodium: 139 mmol/L (ref 135–145)
TCO2: 24 mmol/L (ref 22–32)

## 2022-06-27 LAB — POCT PREGNANCY, URINE: Preg Test, Ur: NEGATIVE

## 2022-06-27 SURGERY — CYSTOSCOPY, WITH RETROGRADE PYELOGRAM AND URETERAL STENT INSERTION
Anesthesia: General | Site: Renal | Laterality: Right

## 2022-06-27 MED ORDER — IOHEXOL 300 MG/ML  SOLN
INTRAMUSCULAR | Status: DC | PRN
  Administered 2022-06-27: 26 mL via URETHRAL

## 2022-06-27 MED ORDER — LIDOCAINE 2% (20 MG/ML) 5 ML SYRINGE
INTRAMUSCULAR | Status: DC | PRN
  Administered 2022-06-27: 60 mg via INTRAVENOUS

## 2022-06-27 MED ORDER — ONDANSETRON HCL 4 MG/2ML IJ SOLN
INTRAMUSCULAR | Status: AC
  Filled 2022-06-27: qty 2

## 2022-06-27 MED ORDER — PROPOFOL 500 MG/50ML IV EMUL
INTRAVENOUS | Status: DC | PRN
  Administered 2022-06-27: 200 ug/kg/min via INTRAVENOUS

## 2022-06-27 MED ORDER — CEPHALEXIN 500 MG PO CAPS: 500.0000 mg | ORAL_CAPSULE | Freq: Two times a day (BID) | ORAL | 0 refills | Status: AC

## 2022-06-27 MED ORDER — KETOROLAC TROMETHAMINE 30 MG/ML IJ SOLN
INTRAMUSCULAR | Status: AC
  Filled 2022-06-27: qty 1

## 2022-06-27 MED ORDER — DEXAMETHASONE SODIUM PHOSPHATE 10 MG/ML IJ SOLN
INTRAMUSCULAR | Status: DC | PRN
  Administered 2022-06-27 (×2): 5 mg via INTRAVENOUS

## 2022-06-27 MED ORDER — SCOPOLAMINE 1 MG/3DAYS TD PT72
1.0000 | MEDICATED_PATCH | TRANSDERMAL | Status: DC
  Administered 2022-06-27: 1.5 mg via TRANSDERMAL

## 2022-06-27 MED ORDER — MIDAZOLAM HCL 2 MG/2ML IJ SOLN
INTRAMUSCULAR | Status: DC | PRN
  Administered 2022-06-27: 2 mg via INTRAVENOUS

## 2022-06-27 MED ORDER — OXYBUTYNIN CHLORIDE 5 MG PO TABS: 5.0000 mg | ORAL_TABLET | Freq: Three times a day (TID) | ORAL | 1 refills | Status: DC | PRN

## 2022-06-27 MED ORDER — PROPOFOL 10 MG/ML IV BOLUS
INTRAVENOUS | Status: AC
  Filled 2022-06-27: qty 20

## 2022-06-27 MED ORDER — PHENAZOPYRIDINE HCL 200 MG PO TABS: 200.0000 mg | ORAL_TABLET | Freq: Three times a day (TID) | ORAL | 0 refills | Status: DC | PRN

## 2022-06-27 MED ORDER — HYDROMORPHONE HCL 1 MG/ML IJ SOLN: 0.2500 mg | INTRAMUSCULAR | Status: DC | PRN

## 2022-06-27 MED ORDER — WHITE PETROLATUM EX OINT
TOPICAL_OINTMENT | CUTANEOUS | Status: AC
  Filled 2022-06-27: qty 5

## 2022-06-27 MED ORDER — MIDAZOLAM HCL 2 MG/2ML IJ SOLN
INTRAMUSCULAR | Status: AC
  Filled 2022-06-27: qty 2

## 2022-06-27 MED ORDER — PROPOFOL 500 MG/50ML IV EMUL
INTRAVENOUS | Status: AC
  Filled 2022-06-27: qty 50

## 2022-06-27 MED ORDER — LIDOCAINE HCL (PF) 2 % IJ SOLN
INTRAMUSCULAR | Status: AC
  Filled 2022-06-27: qty 5

## 2022-06-27 MED ORDER — 0.9 % SODIUM CHLORIDE (POUR BTL) OPTIME
TOPICAL | Status: DC | PRN
  Administered 2022-06-27: 500 mL

## 2022-06-27 MED ORDER — SODIUM CHLORIDE 0.9 % IR SOLN
Status: DC | PRN
  Administered 2022-06-27: 3000 mL

## 2022-06-27 MED ORDER — AMISULPRIDE (ANTIEMETIC) 5 MG/2ML IV SOLN: 10.0000 mg | Freq: Once | INTRAVENOUS | Status: DC | PRN

## 2022-06-27 MED ORDER — OXYCODONE HCL 5 MG/5ML PO SOLN: 5.0000 mg | Freq: Once | ORAL | Status: DC | PRN

## 2022-06-27 MED ORDER — OXYCODONE HCL 5 MG PO TABS: 5.0000 mg | ORAL_TABLET | Freq: Once | ORAL | Status: DC | PRN

## 2022-06-27 MED ORDER — ONDANSETRON HCL 4 MG/2ML IJ SOLN
INTRAMUSCULAR | Status: DC | PRN
  Administered 2022-06-27: 4 mg via INTRAVENOUS

## 2022-06-27 MED ORDER — ACETAMINOPHEN 10 MG/ML IV SOLN: 1000.0000 mg | Freq: Once | INTRAVENOUS | Status: DC | PRN

## 2022-06-27 MED ORDER — HYDROCODONE-ACETAMINOPHEN 5-325 MG PO TABS: 1.0000 | ORAL_TABLET | ORAL | 0 refills | Status: DC | PRN

## 2022-06-27 MED ORDER — ONDANSETRON HCL 4 MG/2ML IJ SOLN: 4.0000 mg | Freq: Once | INTRAMUSCULAR | Status: DC | PRN

## 2022-06-27 MED ORDER — KETOROLAC TROMETHAMINE 30 MG/ML IJ SOLN
INTRAMUSCULAR | Status: DC | PRN
  Administered 2022-06-27: 30 mg via INTRAVENOUS

## 2022-06-27 MED ORDER — ONDANSETRON HCL 4 MG PO TABS: 4.0000 mg | ORAL_TABLET | Freq: Every day | ORAL | 1 refills | Status: DC | PRN

## 2022-06-27 MED ORDER — LACTATED RINGERS IV SOLN: INTRAVENOUS | Status: DC

## 2022-06-27 MED ORDER — SCOPOLAMINE 1 MG/3DAYS TD PT72
MEDICATED_PATCH | TRANSDERMAL | Status: AC
  Filled 2022-06-27: qty 1

## 2022-06-27 MED ORDER — PROPOFOL 10 MG/ML IV BOLUS
INTRAVENOUS | Status: DC | PRN
  Administered 2022-06-27: 200 mg via INTRAVENOUS

## 2022-06-27 MED ORDER — DEXAMETHASONE SODIUM PHOSPHATE 10 MG/ML IJ SOLN
INTRAMUSCULAR | Status: AC
  Filled 2022-06-27: qty 1

## 2022-06-27 MED ORDER — FENTANYL CITRATE (PF) 100 MCG/2ML IJ SOLN
INTRAMUSCULAR | Status: AC
  Filled 2022-06-27: qty 2

## 2022-06-27 MED ORDER — CEFAZOLIN SODIUM-DEXTROSE 2-4 GM/100ML-% IV SOLN
2.0000 g | Freq: Once | INTRAVENOUS | Status: AC
  Administered 2022-06-27: 2 g via INTRAVENOUS

## 2022-06-27 MED ORDER — FENTANYL CITRATE (PF) 100 MCG/2ML IJ SOLN
INTRAMUSCULAR | Status: DC | PRN
Start: 2022-06-27 — End: 2022-06-27
  Administered 2022-06-27: 25 ug via INTRAVENOUS
  Administered 2022-06-27: 50 ug via INTRAVENOUS
  Administered 2022-06-27: 25 ug via INTRAVENOUS

## 2022-06-27 MED ORDER — CEFAZOLIN SODIUM-DEXTROSE 2-4 GM/100ML-% IV SOLN
INTRAVENOUS | Status: AC
  Filled 2022-06-27: qty 100

## 2022-06-27 SURGICAL SUPPLY — 26 items
APL SKNCLS STERI-STRIP NONHPOA (GAUZE/BANDAGES/DRESSINGS)
BAG DRAIN URO-CYSTO SKYTR STRL (DRAIN) ×1 IMPLANT
BAG DRN UROCATH (DRAIN) ×1
BASKET STONE 1.7 NGAGE (UROLOGICAL SUPPLIES) IMPLANT
BASKET ZERO TIP NITINOL 2.4FR (BASKET) ×1 IMPLANT
BENZOIN TINCTURE PRP APPL 2/3 (GAUZE/BANDAGES/DRESSINGS) IMPLANT
BSKT STON RTRVL ZERO TP 2.4FR (BASKET)
CATH URETL OPEN 5X70 (CATHETERS) IMPLANT
CLOTH BEACON ORANGE TIMEOUT ST (SAFETY) ×1 IMPLANT
FIBER LASER FLEXIVA 365 (UROLOGICAL SUPPLIES) IMPLANT
GLOVE BIO SURGEON STRL SZ7.5 (GLOVE) ×1 IMPLANT
GOWN STRL REUS W/TWL XL LVL3 (GOWN DISPOSABLE) ×1 IMPLANT
GUIDEWIRE STR DUAL SENSOR (WIRE) IMPLANT
GUIDEWIRE ZIPWRE .038 STRAIGHT (WIRE) ×1 IMPLANT
IV NS IRRIG 3000ML ARTHROMATIC (IV SOLUTION) ×2 IMPLANT
KIT TURNOVER CYSTO (KITS) ×1 IMPLANT
MANIFOLD NEPTUNE II (INSTRUMENTS) ×1 IMPLANT
NS IRRIG 500ML POUR BTL (IV SOLUTION) ×1 IMPLANT
PACK CYSTO (CUSTOM PROCEDURE TRAY) ×1 IMPLANT
STENT URET 6FRX24 CONTOUR (STENTS) IMPLANT
STRIP CLOSURE SKIN 1/2X4 (GAUZE/BANDAGES/DRESSINGS) IMPLANT
SYR 10ML LL (SYRINGE) ×1 IMPLANT
TRACTIP FLEXIVA PULS ID 200XHI (Laser) IMPLANT
TRACTIP FLEXIVA PULSE ID 200 (Laser)
TUBE CONNECTING 12X1/4 (SUCTIONS) IMPLANT
TUBING UROLOGY SET (TUBING) ×1 IMPLANT

## 2022-06-27 NOTE — H&P (Signed)
Office Visit Report     06/02/2022   --------------------------------------------------------------------------------   Samantha Meyers  MRN: [id number]  DOB: 10/04/1981, 41 year old Female  SSN:    PRIMARY CARE:  Orland Mustard, MD  REFERRING:  Orland Mustard, MD  PROVIDER:  Irine Seal, M.D.  TREATING:  Ellison Hughs, M.D.  LOCATION:  [facility] 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   -She reports two UTIs in the past 6 months that progressed to pyelonephritis  -She reports intermittent episodes of dull right-sided flank pain with no other associated symptoms  -Urine cultures from 05/04/2022, 11/13/2021 and 09/24/2021 grew pansensitive E. coli.  -No definitive crossing vessel on CT  -Multiple small, nonobstructing right renal stones noted on CT  -No recent MAG3 renal scan  -Hx of Non-Hodkins Lymphoma--treated with chemo/radiation in her 83s.  -Long hx of kidney stones--states that she has passed >50 small stones throughout her life--never required surgical intervention  -Hx of right ureteral stent placed in her 57s that created a lot of discomfort. She states that she was told she had a "nick in her bladder and urine leak"--no plan for intervention from there  -Her last serum creatinine from 05/04/2022 was 0.93 with an EGFR greater than 60   Interval: Lasix renogram from 06/20/2022 showed a differential function of 45% on the left and 55% on the right.  T1/2 post Lasix was 21.7 minutes on the left and 8.9 minutes on the right.  IMPRESSION: Small LEFT kidney than RIGHT.   Normal LEFT renogram.   Dilated RIGHT renal collecting system with good washout of tracer following diuretic administration, indicating an absence of significant urinary outflow  obstruction.    ALLERGIES: No Known Allergies    MEDICATIONS: Albuterol Sulfate  Losartan Potassium 25 mg tablet  Synthroid  Tramadol Hcl  Trazodone Hcl 50 mg tablet  Venlafaxine Hcl Er 150 mg capsule, ext release 24 hr  Vitamin D3  Vyvanse 10 mg capsule     GU PSH: None     PSH Notes: Blockage surgery 2021 UPJ obstruction repair with stent.  Kidney surgery.   NON-GU PSH: Tonsillectomy     GU PMH: None     PMH Notes: Cancer lymphoma.  Kidney stone.   NON-GU PMH: Anxiety Depression Hypertension Hypothyroidism    FAMILY HISTORY: 3 Son's - Runs in Family Diabetes - Son   SOCIAL HISTORY: Marital Status: Single Preferred Language: English; Ethnicity: Not Hispanic Or Latino; Race: White Current Smoking Status: Patient has never smoked.   Tobacco Use Assessment Completed: Used Tobacco in last 30 days? Does drink.  Drinks 2 caffeinated drinks per day.    REVIEW OF SYSTEMS:    GU Review Female:   Patient denies frequent urination, hard to postpone urination, burning /pain with urination, get up at night to urinate, leakage of urine, stream starts and stops, trouble starting your stream, have to strain to urinate, and being pregnant.  Gastrointestinal (Upper):   Patient denies nausea, vomiting, and indigestion/ heartburn.  Gastrointestinal (Lower):   Patient denies diarrhea and constipation.  Constitutional:   Patient denies fever, night sweats, weight loss, and fatigue.  Skin:   Patient denies skin rash/ lesion and itching.  Eyes:   Patient denies blurred vision and double vision.  Ears/ Nose/ Throat:   Patient denies sore throat and sinus problems.  Hematologic/Lymphatic:   Patient denies swollen glands and easy bruising.  Cardiovascular:   Patient denies leg swelling and chest pains.  Respiratory:   Patient denies cough and shortness of breath.  Endocrine:   Patient denies excessive thirst.  Musculoskeletal:   Patient denies back pain and joint pain.   Neurological:   Patient denies headaches and dizziness.  Psychologic:   Patient reports anxiety and depression.    VITAL SIGNS:      06/02/2022 02:42 PM  Weight 180 lb / 81.65 kg  Height 63 in / 160.02 cm  BP 128/88 mmHg  Temperature 97.1 F / 36.1 C  BMI 31.9 kg/m   MULTI-SYSTEM PHYSICAL EXAMINATION:    Constitutional: Well-nourished. No physical deformities. Normally developed. Good grooming.  Neurologic / Psychiatric: Oriented to time, oriented to place, oriented to person. No depression, no anxiety, no agitation.  Musculoskeletal: Normal gait and station of head and neck.     Complexity of Data:  X-Ray Review: C.T. Abdomen/Pelvis: Reviewed Films. Reviewed Report. Discussed With Patient.    Notes:                     CLINICAL DATA: Right flank pain. Nephrolithiasis. Congenital right  UPJ obstruction. Chronic urinary tract infections.   EXAM:  CT ABDOMEN AND PELVIS WITHOUT AND WITH CONTRAST   TECHNIQUE:  Multidetector CT imaging of the abdomen and pelvis was performed  following the standard protocol before and following the bolus  administration of intravenous contrast.   RADIATION DOSE REDUCTION: This exam was performed according to the  departmental dose-optimization program which includes automated  exposure control, adjustment of the mA and/or kV according to  patient size and/or use of iterative reconstruction technique.   CONTRAST: 179m OMNIPAQUE IOHEXOL 300 MG/ML SOLN   COMPARISON: None Available.   FINDINGS:  Lower Chest: No acute findings.   Hepatobiliary: No hepatic masses identified. Gallbladder is  unremarkable. No evidence of biliary ductal dilatation.   Pancreas: No mass or inflammatory changes.   Spleen: Within normal limits in size and appearance.   Adrenals/Urinary Tract: Multiple tiny less than 5 mm right renal  calculi are seen. Severe right renal pelvicaliectasis is seen,  without evidence of ureterectasis or ureteral calculi. No evidence   of renal mass. Unremarkable unopacified urinary bladder.   Stomach/Bowel: No evidence of obstruction, inflammatory process or  abnormal fluid collections. Normal appendix visualized.   Vascular/Lymphatic: No pathologically enlarged lymph nodes. No acute  vascular findings.   Reproductive: Mass seen in the right uterine fundus measuring  approximately 3.5 cm, consistent with uterine fibroid. Adnexal  regions are unremarkable.   Other: None.   Musculoskeletal: No suspicious bone lesions identified.   IMPRESSION:  Severe right renal pelvicaliectasis, without evidence of  ureterectasis or ureteral calculi. This is consistent with patient  history of chronic right UPJ obstruction.   Nonobstructing right nephrolithiasis.   Small right sided uterine fibroid.    Electronically Signed  By: JMarlaine HindM.D.  On: 05/09/2022 11:52   PROCEDURES:          Urinalysis Dipstick Dipstick Cont'd  Color: Yellow Bilirubin: Neg mg/dL  Appearance: Clear Ketones: Neg mg/dL  Specific Gravity: 1.015 Blood: Neg ery/uL  pH: 7.0 Protein: Neg mg/dL  Glucose: Neg mg/dL Urobilinogen: 0.2 mg/dL    Nitrites: Neg    Leukocyte Esterase: Neg leu/uL    ASSESSMENT:      ICD-10 Details  1 GU:   Ureteral stricture - N13.5 Right, Chronic, Worsening  PLAN:            Medications New Meds: Cephalexin 500 mg capsule 1 capsule PO BID As Directed Take as needed twice daily for 5-7 days for UTI symptoms  #30  3 Refill(s)  Pharmacy Name:  [id number] Address:  [address]  [place] [address] Phone:  [phone] Fax:  [phone]           Orders X-Rays: Lasix Renogram          Schedule Return Visit/Planned Activity: Next Available Appointment - Schedule Surgery          Document Letter(s):  Created for Patient: Clinical Summary         Notes:    -Plan for Lasix renogram and diagnostic ureteroscopy in the coming weeks. The potential need for a pyeloplasty was  discussed.  -I discussed UTI prevention strategies (hydration, good hygiene practices, cranberry supplementation, probiotics with lactobacillus, vitamin C)  -Prescription for self start cephalexin was provided  -The risk, benefits and alternatives of cystoscopy with right ureteroscopy possible right ureteral stent placement was discussed in detail. Risk include, but are not limited to, bleeding, urinary tract infection, urethral stricture formation, stent related discomfort, MI, CVA, DVT and anesthesia. She voices understanding and

## 2022-06-27 NOTE — Anesthesia Procedure Notes (Signed)
Procedure Name: LMA Insertion Date/Time: 06/27/2022 12:52 PM  Performed by: Suan Halter, CRNAPre-anesthesia Checklist: Patient identified, Emergency Drugs available, Suction available and Patient being monitored Patient Re-evaluated:Patient Re-evaluated prior to induction Oxygen Delivery Method: Circle system utilized Preoxygenation: Pre-oxygenation with 100% oxygen Induction Type: IV induction Ventilation: Mask ventilation without difficulty LMA: LMA inserted LMA Size: 4.0 Number of attempts: 1 Airway Equipment and Method: Bite block Placement Confirmation: positive ETCO2 Tube secured with: Tape Dental Injury: Teeth and Oropharynx as per pre-operative assessment

## 2022-06-27 NOTE — Discharge Instructions (Signed)
No ibuprofen, Advil, Aleve, Motrin, ketorolac, meloxicam, naproxen, or other NSAIDS until after 7:15 pm today if needed.       Post Anesthesia Home Care Instructions  Activity: Get plenty of rest for the remainder of the day. A responsible individual must stay with you for 24 hours following the procedure.  For the next 24 hours, DO NOT: -Drive a car -Paediatric nurse -Drink alcoholic beverages -Take any medication unless instructed by your physician -Make any legal decisions or sign important papers.  Meals: Start with liquid foods such as gelatin or soup. Progress to regular foods as tolerated. Avoid greasy, spicy, heavy foods. If nausea and/or vomiting occur, drink only clear liquids until the nausea and/or vomiting subsides. Call your physician if vomiting continues.  Special Instructions/Symptoms: Your throat may feel dry or sore from the anesthesia or the breathing tube placed in your throat during surgery. If this causes discomfort, gargle with warm salt water. The discomfort should disappear within 24 hours.  If you had a scopolamine patch placed behind your ear for the management of post- operative nausea and/or vomiting:  1. The medication in the patch is effective for 72 hours, after which it should be removed.  Wrap patch in a tissue and discard in the trash. Wash hands thoroughly with soap and water. 2. You may remove the patch earlier than 72 hours if you experience unpleasant side effects which may include dry mouth, dizziness or visual disturbances. 3. Avoid touching the patch. Wash your hands with soap and water after contact with the patch.

## 2022-06-27 NOTE — Transfer of Care (Signed)
Immediate Anesthesia Transfer of Care Note  Patient: Samantha Meyers  Procedure(s) Performed: Procedure(s) (LRB): CYSTOSCOPY WITH BILATERAL RETROGRADE PYELOGRAM/ URETEROSCOPY/URETERAL STENT PLACEMENT (Right)  Patient Location: PACU  Anesthesia Type: General  Level of Consciousness: awake, oriented, sedated and patient cooperative  Airway & Oxygen Therapy: Patient Spontanous Breathing and Patient connected to face mask oxygen  Post-op Assessment: Report given to PACU RN and Post -op Vital signs reviewed and stable  Post vital signs: Reviewed and stable  Complications: No apparent anesthesia complications Last Vitals:  Vitals Value Taken Time  BP 124/74 06/27/22 1330  Temp    Pulse 73 06/27/22 1332  Resp 14 06/27/22 1332  SpO2 96 % 06/27/22 1332  Vitals shown include unvalidated device data.  Last Pain:  Vitals:   06/27/22 1139  TempSrc: Oral  PainSc: 0-No pain      Patients Stated Pain Goal: 5 (79/89/21 1941)  Complications: No notable events documented.

## 2022-06-27 NOTE — Op Note (Signed)
Operative Note  Preoperative diagnosis:  1.  Right UPJ obstruction  Postoperative diagnosis: 1.  Right UPJ obstruction  Procedure(s): 1.  Cystoscopy with right ureteroscopy and right ureteral stent placement 2.  Bilateral retrograde pyelograms with intraoperative interpretation fluoroscopic imaging  Surgeon: Ellison Hughs, MD  Assistants:  None  Anesthesia:  General  Complications:  None  EBL: None  Specimens: 1.  None  Drains/Catheters: 1.  Right 6 French, 24 cm JJ stent without  Intraoperative findings:   Right retrograde pyelogram revealed contrast jetting at the right UPJ with a diffusely dilated renal pelvis and calyceal system.  No other filling defects were seen along the remaining distal aspects of the right ureter Narrowing of the right UPJ with no distinct intrinsic stricture.  The narrowing at the right UVP was easily bypassed by the flexible ureteroscope Solitary left collecting system with no filling defects or dilation involving the left ureter or left renal pelvis seen on retrograde pyelogram No intravesical abnormalities  Indication:  Samantha Meyers is a 41 y.o. female with history of a right-sided UPJ obstruction that was initially identified on CT from 05/09/2022.  For the past 6 months, she has had 2 febrile UTIs that progressed to pyelonephritis.  Recent MAG3 renal scan showed a differential function of 45% on the left and 55% on the right.  T1/2 post Lasix was 21.7 minutes on the left and 8.9 minutes on the right.  She is here today for diagnostic ureteroscopy.  She has been consented for the above procedures, voices understanding and wishes to proceed.  Description of procedure:  After informed consent was obtained, the patient was brought to the operating room and general LMA anesthesia was administered. The patient was then placed in the dorsolithotomy position and prepped and draped in the usual sterile fashion. A timeout was performed. A 23  French rigid cystoscope was then inserted into the urethral meatus and advanced into the bladder under direct vision. A complete bladder survey revealed no intravesical pathology.  A 5 French ureteral catheter was then inserted into the right ureteral orifice and a retrograde pyelogram was obtained, with the findings listed above.  A Glidewire was then used to intubate the lumen of the ureteral catheter and was advanced up to the right renal pelvis, under fluoroscopic guidance.  The catheter was then removed, leaving the wire in place.  A flexible ureteroscope was then advanced alongside the wire and up to the proximal aspects of the right ureter.  She was found to have a very slight narrowing of the right UPJ.  This narrow segment was less than 1 cm in length and the flexible ureteroscope was able to easily bypass it.  A full inspection of the right renal pelvis revealed no pelvicalyceal abnormalities beyond uniform dilation.  The flexible ureteroscope was then removed under direct vision and no other ureteral abnormalities were identified.  A 6 French, 24 cm JJ stent was then advanced over the wire and into good position within the right collecting system, confirming placement via fluoroscopy.  A 5 French ureteral catheter was then inserted into the left ureteral orifice and a retrograde pyelogram was obtained, with the findings listed above.  The patient's bladder was then drained.  She tolerated the procedure well and was transferred to the postanesthesia in stable condition.  Plan: Follow-up on 07/03/2022 for office cystoscopy and stent removal

## 2022-07-01 ENCOUNTER — Encounter (HOSPITAL_BASED_OUTPATIENT_CLINIC_OR_DEPARTMENT_OTHER): Payer: Self-pay | Admitting: Urology

## 2022-07-01 NOTE — Anesthesia Postprocedure Evaluation (Signed)
Anesthesia Post Note  Patient: Samantha Meyers  Procedure(s) Performed: CYSTOSCOPY WITH BILATERAL RETROGRADE PYELOGRAM/ URETEROSCOPY/URETERAL STENT PLACEMENT (Right: Renal)     Patient location during evaluation: PACU Anesthesia Type: General Level of consciousness: awake and alert Pain management: pain level controlled Vital Signs Assessment: post-procedure vital signs reviewed and stable Respiratory status: spontaneous breathing, nonlabored ventilation, respiratory function stable and patient connected to nasal cannula oxygen Cardiovascular status: blood pressure returned to baseline and stable Postop Assessment: no apparent nausea or vomiting Anesthetic complications: no   No notable events documented.  Last Vitals:  Vitals:   06/27/22 1415 06/27/22 1515  BP: (!) 138/95 (!) 142/92  Pulse: 74 63  Resp: 18 16  Temp:  36.6 C  SpO2: 100% 100%    Last Pain:  Vitals:   07/01/22 1305  TempSrc:   PainSc: 5                  Barnet Glasgow

## 2022-07-03 DIAGNOSIS — N135 Crossing vessel and stricture of ureter without hydronephrosis: Secondary | ICD-10-CM | POA: Diagnosis not present

## 2022-07-03 DIAGNOSIS — Z9189 Other specified personal risk factors, not elsewhere classified: Secondary | ICD-10-CM | POA: Insufficient documentation

## 2022-07-09 DIAGNOSIS — F332 Major depressive disorder, recurrent severe without psychotic features: Secondary | ICD-10-CM | POA: Diagnosis not present

## 2022-07-09 DIAGNOSIS — F411 Generalized anxiety disorder: Secondary | ICD-10-CM | POA: Diagnosis not present

## 2022-07-16 DIAGNOSIS — Z8571 Personal history of Hodgkin lymphoma: Secondary | ICD-10-CM | POA: Diagnosis not present

## 2022-07-16 DIAGNOSIS — I5189 Other ill-defined heart diseases: Secondary | ICD-10-CM | POA: Diagnosis not present

## 2022-07-16 DIAGNOSIS — Z9189 Other specified personal risk factors, not elsewhere classified: Secondary | ICD-10-CM | POA: Diagnosis not present

## 2022-07-16 DIAGNOSIS — T66XXXS Radiation sickness, unspecified, sequela: Secondary | ICD-10-CM | POA: Diagnosis not present

## 2022-07-16 DIAGNOSIS — I340 Nonrheumatic mitral (valve) insufficiency: Secondary | ICD-10-CM | POA: Diagnosis not present

## 2022-07-16 DIAGNOSIS — I10 Essential (primary) hypertension: Secondary | ICD-10-CM | POA: Diagnosis not present

## 2022-07-16 DIAGNOSIS — X58XXXS Exposure to other specified factors, sequela: Secondary | ICD-10-CM | POA: Diagnosis not present

## 2022-07-16 DIAGNOSIS — E890 Postprocedural hypothyroidism: Secondary | ICD-10-CM | POA: Diagnosis not present

## 2022-07-23 DIAGNOSIS — F411 Generalized anxiety disorder: Secondary | ICD-10-CM | POA: Diagnosis not present

## 2022-07-23 DIAGNOSIS — F332 Major depressive disorder, recurrent severe without psychotic features: Secondary | ICD-10-CM | POA: Diagnosis not present

## 2022-07-30 DIAGNOSIS — F332 Major depressive disorder, recurrent severe without psychotic features: Secondary | ICD-10-CM | POA: Diagnosis not present

## 2022-07-30 DIAGNOSIS — F411 Generalized anxiety disorder: Secondary | ICD-10-CM | POA: Diagnosis not present

## 2022-08-06 DIAGNOSIS — D2271 Melanocytic nevi of right lower limb, including hip: Secondary | ICD-10-CM | POA: Diagnosis not present

## 2022-08-06 DIAGNOSIS — D2261 Melanocytic nevi of right upper limb, including shoulder: Secondary | ICD-10-CM | POA: Diagnosis not present

## 2022-08-06 DIAGNOSIS — D2262 Melanocytic nevi of left upper limb, including shoulder: Secondary | ICD-10-CM | POA: Diagnosis not present

## 2022-08-06 DIAGNOSIS — F332 Major depressive disorder, recurrent severe without psychotic features: Secondary | ICD-10-CM | POA: Diagnosis not present

## 2022-08-06 DIAGNOSIS — F411 Generalized anxiety disorder: Secondary | ICD-10-CM | POA: Diagnosis not present

## 2022-08-06 DIAGNOSIS — D2272 Melanocytic nevi of left lower limb, including hip: Secondary | ICD-10-CM | POA: Diagnosis not present

## 2022-08-13 DIAGNOSIS — F332 Major depressive disorder, recurrent severe without psychotic features: Secondary | ICD-10-CM | POA: Diagnosis not present

## 2022-08-13 DIAGNOSIS — F900 Attention-deficit hyperactivity disorder, predominantly inattentive type: Secondary | ICD-10-CM | POA: Diagnosis not present

## 2022-08-13 DIAGNOSIS — F411 Generalized anxiety disorder: Secondary | ICD-10-CM | POA: Diagnosis not present

## 2022-08-13 DIAGNOSIS — F330 Major depressive disorder, recurrent, mild: Secondary | ICD-10-CM | POA: Diagnosis not present

## 2022-08-18 DIAGNOSIS — E890 Postprocedural hypothyroidism: Secondary | ICD-10-CM | POA: Diagnosis not present

## 2022-08-18 DIAGNOSIS — Z9189 Other specified personal risk factors, not elsewhere classified: Secondary | ICD-10-CM | POA: Diagnosis not present

## 2022-08-18 DIAGNOSIS — T66XXXS Radiation sickness, unspecified, sequela: Secondary | ICD-10-CM | POA: Diagnosis not present

## 2022-08-18 DIAGNOSIS — I10 Essential (primary) hypertension: Secondary | ICD-10-CM | POA: Diagnosis not present

## 2022-08-18 DIAGNOSIS — N6489 Other specified disorders of breast: Secondary | ICD-10-CM | POA: Diagnosis not present

## 2022-08-18 DIAGNOSIS — Z8571 Personal history of Hodgkin lymphoma: Secondary | ICD-10-CM | POA: Diagnosis not present

## 2022-08-18 DIAGNOSIS — Z1239 Encounter for other screening for malignant neoplasm of breast: Secondary | ICD-10-CM | POA: Diagnosis not present

## 2022-08-18 DIAGNOSIS — Z8572 Personal history of non-Hodgkin lymphomas: Secondary | ICD-10-CM | POA: Diagnosis not present

## 2022-08-18 DIAGNOSIS — Y842 Radiological procedure and radiotherapy as the cause of abnormal reaction of the patient, or of later complication, without mention of misadventure at the time of the procedure: Secondary | ICD-10-CM | POA: Diagnosis not present

## 2022-08-20 DIAGNOSIS — F411 Generalized anxiety disorder: Secondary | ICD-10-CM | POA: Diagnosis not present

## 2022-08-20 DIAGNOSIS — F332 Major depressive disorder, recurrent severe without psychotic features: Secondary | ICD-10-CM | POA: Diagnosis not present

## 2022-08-27 DIAGNOSIS — F332 Major depressive disorder, recurrent severe without psychotic features: Secondary | ICD-10-CM | POA: Diagnosis not present

## 2022-08-27 DIAGNOSIS — F411 Generalized anxiety disorder: Secondary | ICD-10-CM | POA: Diagnosis not present

## 2022-09-10 DIAGNOSIS — F900 Attention-deficit hyperactivity disorder, predominantly inattentive type: Secondary | ICD-10-CM | POA: Diagnosis not present

## 2022-09-10 DIAGNOSIS — F332 Major depressive disorder, recurrent severe without psychotic features: Secondary | ICD-10-CM | POA: Diagnosis not present

## 2022-09-10 DIAGNOSIS — F411 Generalized anxiety disorder: Secondary | ICD-10-CM | POA: Diagnosis not present

## 2022-09-10 DIAGNOSIS — F330 Major depressive disorder, recurrent, mild: Secondary | ICD-10-CM | POA: Diagnosis not present

## 2022-10-01 DIAGNOSIS — F332 Major depressive disorder, recurrent severe without psychotic features: Secondary | ICD-10-CM | POA: Diagnosis not present

## 2022-10-01 DIAGNOSIS — F411 Generalized anxiety disorder: Secondary | ICD-10-CM | POA: Diagnosis not present

## 2022-10-08 DIAGNOSIS — F332 Major depressive disorder, recurrent severe without psychotic features: Secondary | ICD-10-CM | POA: Diagnosis not present

## 2022-10-08 DIAGNOSIS — F411 Generalized anxiety disorder: Secondary | ICD-10-CM | POA: Diagnosis not present

## 2022-10-29 DIAGNOSIS — F411 Generalized anxiety disorder: Secondary | ICD-10-CM | POA: Diagnosis not present

## 2022-10-29 DIAGNOSIS — F332 Major depressive disorder, recurrent severe without psychotic features: Secondary | ICD-10-CM | POA: Diagnosis not present

## 2022-11-12 DIAGNOSIS — F330 Major depressive disorder, recurrent, mild: Secondary | ICD-10-CM | POA: Diagnosis not present

## 2022-11-12 DIAGNOSIS — F900 Attention-deficit hyperactivity disorder, predominantly inattentive type: Secondary | ICD-10-CM | POA: Diagnosis not present

## 2022-11-12 DIAGNOSIS — F411 Generalized anxiety disorder: Secondary | ICD-10-CM | POA: Diagnosis not present

## 2022-11-13 DIAGNOSIS — F411 Generalized anxiety disorder: Secondary | ICD-10-CM | POA: Diagnosis not present

## 2022-11-13 DIAGNOSIS — F332 Major depressive disorder, recurrent severe without psychotic features: Secondary | ICD-10-CM | POA: Diagnosis not present

## 2022-11-18 DIAGNOSIS — F332 Major depressive disorder, recurrent severe without psychotic features: Secondary | ICD-10-CM | POA: Diagnosis not present

## 2022-11-18 DIAGNOSIS — F411 Generalized anxiety disorder: Secondary | ICD-10-CM | POA: Diagnosis not present

## 2022-11-26 DIAGNOSIS — F411 Generalized anxiety disorder: Secondary | ICD-10-CM | POA: Diagnosis not present

## 2022-11-26 DIAGNOSIS — F332 Major depressive disorder, recurrent severe without psychotic features: Secondary | ICD-10-CM | POA: Diagnosis not present

## 2022-11-28 DIAGNOSIS — F900 Attention-deficit hyperactivity disorder, predominantly inattentive type: Secondary | ICD-10-CM | POA: Diagnosis not present

## 2022-11-28 DIAGNOSIS — F411 Generalized anxiety disorder: Secondary | ICD-10-CM | POA: Diagnosis not present

## 2022-11-28 DIAGNOSIS — F330 Major depressive disorder, recurrent, mild: Secondary | ICD-10-CM | POA: Diagnosis not present

## 2022-12-16 ENCOUNTER — Other Ambulatory Visit: Payer: Self-pay | Admitting: Family Medicine

## 2022-12-16 MED ORDER — LEVOTHYROXINE SODIUM 100 MCG PO TABS
100.0000 ug | ORAL_TABLET | Freq: Every day | ORAL | 0 refills | Status: DC
Start: 2022-12-16 — End: 2023-01-11

## 2022-12-16 NOTE — Telephone Encounter (Signed)
Last TSH 01/07/2022 0.81 on 100 McG of levothyroxine . Patient ha Samantha Meyers establish care appt on 01/12/2023 can we fill until appt I have pended for 30 no refills?

## 2022-12-16 NOTE — Telephone Encounter (Signed)
Prescription Request  12/16/2022  Is this a "Controlled Substance" medicine? No  LOV: Visit date not found  What is the name of the medication or equipment? losartan (COZAAR) 25 MG tablet      levothyroxine (SYNTHROID) 100 MCG tablet   Have you contacted your pharmacy to request a refill? Yes   Which pharmacy would you like this sent to?  [id number] [address]Phone: [phone]: [phone]   Patient notified that their request is being sent to the clinical staff for review and that they should receive a response within 2 business days.   Please advise at Mobile [phone](mobile)    The patient has a TOC appointment scheduled for 01/12/23 She is out of her medication

## 2022-12-16 NOTE — Telephone Encounter (Signed)
Patient asking for her Levothyroxine to be refilled. However, this medication is listed under historical. What should we do?

## 2022-12-17 ENCOUNTER — Encounter: Payer: Self-pay | Admitting: *Deleted

## 2022-12-17 NOTE — Telephone Encounter (Signed)
Sent my chart to notify patient script sent.

## 2022-12-24 DIAGNOSIS — F332 Major depressive disorder, recurrent severe without psychotic features: Secondary | ICD-10-CM | POA: Diagnosis not present

## 2022-12-24 DIAGNOSIS — F411 Generalized anxiety disorder: Secondary | ICD-10-CM | POA: Diagnosis not present

## 2022-12-31 DIAGNOSIS — F330 Major depressive disorder, recurrent, mild: Secondary | ICD-10-CM | POA: Diagnosis not present

## 2022-12-31 DIAGNOSIS — F411 Generalized anxiety disorder: Secondary | ICD-10-CM | POA: Diagnosis not present

## 2022-12-31 DIAGNOSIS — F900 Attention-deficit hyperactivity disorder, predominantly inattentive type: Secondary | ICD-10-CM | POA: Diagnosis not present

## 2023-01-02 ENCOUNTER — Telehealth: Payer: Self-pay | Admitting: Nurse Practitioner

## 2023-01-02 DIAGNOSIS — I10 Essential (primary) hypertension: Secondary | ICD-10-CM

## 2023-01-02 MED ORDER — LOSARTAN POTASSIUM 25 MG PO TABS: 25.0000 mg | ORAL_TABLET | Freq: Every day | ORAL | 0 refills | Status: DC

## 2023-01-02 NOTE — Telephone Encounter (Signed)
[facility] enough to cover until toc

## 2023-01-02 NOTE — Telephone Encounter (Signed)
This message is being sent to the Doc of the day.  Patient called and has been out of her losartan (COZAAR) 25 MG tablet medication. Patient states her BP is running around 138/89 and she is requesting enough medication until her TOC with Volanda Napoleon on 01/12/23.

## 2023-01-07 DIAGNOSIS — F332 Major depressive disorder, recurrent severe without psychotic features: Secondary | ICD-10-CM | POA: Diagnosis not present

## 2023-01-07 DIAGNOSIS — F411 Generalized anxiety disorder: Secondary | ICD-10-CM | POA: Diagnosis not present

## 2023-01-08 ENCOUNTER — Other Ambulatory Visit: Payer: Self-pay | Admitting: Internal Medicine

## 2023-01-11 NOTE — Progress Notes (Signed)
   SUBJECTIVE:  No chief complaint on file.  HPI Patient presents to clinic for transfer of care.  Hypertension Currently on losartan 100 mg daily.  Denies any symptoms of visual changes, headaches, chest pain, shortness of breath, abdominal pain or lower extremity edema. Blood pressure at home***  Hypothyroidism Takes levothyroxine 100 mcg's daily.  Recent TSH 0.81.  Currently asymptomatic.  Mood disorder. Currently on Effexor XR 150 mg daily and trazodone 50 mg nightly.  Doing well.  Denies any SI/HI.  Follows with Dr. Nicolasa Ducking  ADHD Doing well.  Denies any chest pain or heart palpitations.  Currently on Vyvanse 10 mg daily.  Follows with Dr. Nicolasa Ducking   PERTINENT PMH / [place]: Hypertension Hypothyroidism Mood disorder ADHD History pyelonephritis  History of Hodgkin's lymphoma, remission since 2005  OBJECTIVE:  There were no vitals taken for this visit.   Physical Exam  ASSESSMENT/PLAN:  There are no diagnoses linked to this encounter. PDMP reviewed***  No follow-ups on file.  Carollee Leitz, MD

## 2023-01-11 NOTE — Patient Instructions (Incomplete)
It was a pleasure meeting you today. Thank you for allowing me to take part in your health care.  Our goals for today as we discussed include:  Continue losartan 25 mg nightly for your blood pressure Continue levothyroxine 100 mcg's daily for your thyroid  We will get some labs today.  If they are abnormal or we need to do something about them, I will call you.  If they are normal, I will send you a message on MyChart (if it is active) or a letter in the mail.  If you don't hear from [place] in 2 weeks, please call the office at the number below.  If you have any questions or concerns, please do not hesitate to call the office at [phone].  COVID booster  Referral sent for Mammogram. Please call to schedule appointment.  Due September 2024. [facility] [place],  [id number] [phone]   Urine negative for infection.  Sending for culture to confirm.  Increase water intake.  64 ounces daily Start probiotics daily  Flu vaccine today  Schedule follow-up appointment to discuss initiation of birth control pills.  Schedule annual physical appointment next few months.    I look forward to our next visit and until then take care and stay safe.  Regards,   Carollee Leitz, MD   [facility]

## 2023-01-12 ENCOUNTER — Encounter: Payer: Self-pay | Admitting: Family Medicine

## 2023-01-12 ENCOUNTER — Ambulatory Visit: Payer: BC Managed Care – PPO | Admitting: Family Medicine

## 2023-01-12 VITALS — BP 118/78 | HR 86 | Temp 97.8°F | Ht 63.0 in | Wt 190.0 lb

## 2023-01-12 DIAGNOSIS — E039 Hypothyroidism, unspecified: Secondary | ICD-10-CM

## 2023-01-12 DIAGNOSIS — F39 Unspecified mood [affective] disorder: Secondary | ICD-10-CM

## 2023-01-12 DIAGNOSIS — Z23 Encounter for immunization: Secondary | ICD-10-CM | POA: Diagnosis not present

## 2023-01-12 DIAGNOSIS — Z13220 Encounter for screening for lipoid disorders: Secondary | ICD-10-CM | POA: Diagnosis not present

## 2023-01-12 DIAGNOSIS — I10 Essential (primary) hypertension: Secondary | ICD-10-CM | POA: Diagnosis not present

## 2023-01-12 DIAGNOSIS — F900 Attention-deficit hyperactivity disorder, predominantly inattentive type: Secondary | ICD-10-CM

## 2023-01-12 DIAGNOSIS — R350 Frequency of micturition: Secondary | ICD-10-CM | POA: Diagnosis not present

## 2023-01-12 DIAGNOSIS — Z1231 Encounter for screening mammogram for malignant neoplasm of breast: Secondary | ICD-10-CM | POA: Insufficient documentation

## 2023-01-12 LAB — COMPREHENSIVE METABOLIC PANEL
ALT: 20 U/L (ref 0–35)
AST: 21 U/L (ref 0–37)
Albumin: 3.8 g/dL (ref 3.5–5.2)
Alkaline Phosphatase: 80 U/L (ref 39–117)
BUN: 12 mg/dL (ref 6–23)
CO2: 29 mEq/L (ref 19–32)
Calcium: 8.9 mg/dL (ref 8.4–10.5)
Chloride: 102 mEq/L (ref 96–112)
Creatinine, Ser: 0.89 mg/dL (ref 0.40–1.20)
GFR: 79.67 mL/min (ref >60.00)
Glucose, Bld: 89 mg/dL (ref 70–99)
Potassium: 4.1 mEq/L (ref 3.5–5.1)
Sodium: 138 mEq/L (ref 135–145)
Total Bilirubin: 0.4 mg/dL (ref 0.2–1.2)
Total Protein: 6.4 g/dL (ref 6.0–8.3)

## 2023-01-12 LAB — POC URINALSYSI DIPSTICK (AUTOMATED)
Blood, UA: NEGATIVE
Glucose, UA: NEGATIVE
Leukocytes, UA: NEGATIVE — AB
Nitrite, UA: NEGATIVE
Protein, UA: POSITIVE — AB
Spec Grav, UA: 1.025 (ref 1.010–1.025)
Urobilinogen, UA: 0.2 E.U./dL
pH, UA: 5 (ref 5.0–8.0)

## 2023-01-12 LAB — LIPID PANEL
Cholesterol: 171 mg/dL (ref 0–200)
HDL: 76.3 mg/dL (ref >39.00)
LDL Cholesterol: 77 mg/dL (ref 0–99)
NonHDL: 94.62
Total CHOL/HDL Ratio: 2
Triglycerides: 88 mg/dL (ref 0.0–149.0)
VLDL: 17.6 mg/dL (ref 0.0–40.0)

## 2023-01-12 LAB — TSH: TSH: 0.35 u[IU]/mL (ref 0.35–5.50)

## 2023-01-12 MED ORDER — LEVOTHYROXINE SODIUM 100 MCG PO TABS
100.0000 ug | ORAL_TABLET | Freq: Every day | ORAL | 3 refills | Status: DC
Start: 2023-01-12 — End: 2024-01-06

## 2023-01-12 MED ORDER — LOSARTAN POTASSIUM 25 MG PO TABS: 25.0000 mg | ORAL_TABLET | Freq: Every day | ORAL | 3 refills | Status: DC

## 2023-01-12 MED ORDER — SACCHAROMYCES BOULARDII 250 MG PO CAPS: 250.0000 mg | ORAL_CAPSULE | Freq: Every day | ORAL | 0 refills | Status: AC

## 2023-01-12 NOTE — Assessment & Plan Note (Signed)
Chronic.  Stable.  Status post radiation treatment secondary to Hodgkin's lymphoma now in remission. Refill levothyroxine 100 mcg daily Check TSH

## 2023-01-12 NOTE — Assessment & Plan Note (Signed)
Chronic.  Stable.  Well-controlled Refill losartan 25 mg daily Check labs today

## 2023-01-12 NOTE — Assessment & Plan Note (Signed)
Chronic.  Stable. Currently on Adderall 5 mg every afternoon and Vyvanse 10 mg daily Tolerating medication well. Follows with psychiatry.

## 2023-01-12 NOTE — Assessment & Plan Note (Signed)
Urine negative for leukocytes and nitrites.  Positive ketones and protein. Sending for culture, if positive will treat Start probiotics daily Increase water intake Follow-up if symptoms worsen.

## 2023-01-12 NOTE — Assessment & Plan Note (Signed)
Chronic.  Stable.  Denies any SI/HI. Follows with therapist weekly Currently on Effexor XR 150 mg daily trazodone 50 mg nightly.  Recently started on Vraylar 1.5 mg daily Follow-up with Dr. Nicolasa Ducking as scheduled

## 2023-01-13 LAB — URINE CULTURE
MICRO NUMBER:: 14705335
SPECIMEN QUALITY:: ADEQUATE

## 2023-01-14 DIAGNOSIS — F332 Major depressive disorder, recurrent severe without psychotic features: Secondary | ICD-10-CM | POA: Diagnosis not present

## 2023-01-14 DIAGNOSIS — F411 Generalized anxiety disorder: Secondary | ICD-10-CM | POA: Diagnosis not present

## 2023-01-19 DIAGNOSIS — F411 Generalized anxiety disorder: Secondary | ICD-10-CM | POA: Diagnosis not present

## 2023-01-19 DIAGNOSIS — F900 Attention-deficit hyperactivity disorder, predominantly inattentive type: Secondary | ICD-10-CM | POA: Diagnosis not present

## 2023-01-19 DIAGNOSIS — F330 Major depressive disorder, recurrent, mild: Secondary | ICD-10-CM | POA: Diagnosis not present

## 2023-01-20 ENCOUNTER — Ambulatory Visit
Admission: EM | Admit: 2023-01-20 | Discharge: 2023-01-20 | Disposition: A | Discharge status: Home / Self Care | Payer: BC Managed Care – PPO | Attending: Urgent Care | Admitting: Urgent Care

## 2023-01-20 ENCOUNTER — Ambulatory Visit: Payer: BC Managed Care – PPO | Admitting: Family Medicine

## 2023-01-20 DIAGNOSIS — R591 Generalized enlarged lymph nodes: Secondary | ICD-10-CM | POA: Diagnosis not present

## 2023-01-20 DIAGNOSIS — J0100 Acute maxillary sinusitis, unspecified: Secondary | ICD-10-CM

## 2023-01-20 DIAGNOSIS — B3731 Acute candidiasis of vulva and vagina: Secondary | ICD-10-CM

## 2023-01-20 MED ORDER — AMOXICILLIN-POT CLAVULANATE 875-125 MG PO TABS: 1.0000 | ORAL_TABLET | Freq: Two times a day (BID) | ORAL | 0 refills | Status: DC

## 2023-01-20 MED ORDER — PREDNISONE 20 MG PO TABS: ORAL_TABLET | ORAL | 0 refills | Status: AC

## 2023-01-20 MED ORDER — FLUCONAZOLE 150 MG PO TABS: 150.0000 mg | ORAL_TABLET | Freq: Once | ORAL | 0 refills | Status: AC

## 2023-01-20 NOTE — ED Provider Notes (Signed)
[facility]    CSN: [id number] Arrival date & time: 01/20/23  1344      History   Chief Complaint Chief Complaint  Patient presents with   Sore Throat    HPI Samantha Meyers is a 42 y.o. female.    Sore Throat    Presents to urgent care with complaint of symptoms x 6 days including sore throat, bilateral ear pain, swollen and tender lymph nodes bilaterally.  Patient states that lymph nodes are more painful throat.  No documented fever.  Denies cough.  Denies congestion.  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   COVID-19 02/2021   mild all symptoms resolved   Depression    History of kidney stones    Hodgkin's lymphoma ([place])    dx'ed 2005 tx'ed with chemo/radiation   Hypertension    Left ankle tendonitis 10/02/2021   PONV (postoperative nausea and vomiting)    Right wrist tendonitis 10/02/2021   Sprain of right ankle 12/31/2021   Tendonitis 10/02/2021   Thyroid disease    UTI (urinary tract infection)    recurrent   Wears contact lenses    Wrist pain 09/28/2018    Patient Active Problem List   Diagnosis Date Noted   Urinary frequency 01/12/2023   Lipid screening 01/12/2023   Breast cancer screening by mammogram 01/12/2023   Need for immunization against influenza 01/12/2023   At increased risk for cardiovascular disease 07/03/2022   Anxiety 12/26/2021   Toxicity of radiation, 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   Keratosis pilaris 02/12/2018   H/O Hodgkin's lymphoma 01/21/2018   Seasonal allergies 01/21/2018   ADHD (attention deficit hyperactivity disorder), 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   Personal history of radiation exposure 06/08/2017   Mood disorder ([place]) 05/21/2017   Hypothyroidism 05/21/2017    Hypertensive disorder 05/06/2017    Past Surgical History:  Procedure Laterality Date   CYSTOSCOPY W/ URETERAL STENT PLACEMENT Right 06/27/2022   Procedure: CYSTOSCOPY WITH BILATERAL RETROGRADE PYELOGRAM/ URETEROSCOPY/URETERAL STENT PLACEMENT;  Surgeon: Ceasar Mons, MD;  Location: [facility];  Service: Urology;  Laterality: Right;   KIDNEY SURGERY     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      Home Medications    Prior to Admission medications   Medication Sig Start Date End Date Taking? Authorizing Provider  albuterol (PROAIR HFA) 108 (90 Base) MCG/ACT inhaler Inhale 1-2 puffs into the lungs every 4 (four) hours as needed for wheezing or shortness of breath. 03/22/21   McLean-Scocuzza, Nino Glow, MD  amphetamine-dextroamphetamine (ADDERALL) 5 MG tablet SMARTSIG:1 Tablet(s) By Mouth Every Evening 11/28/22   [provider]  cholecalciferol (VITAMIN D3) 25 MCG (1000 UNIT) tablet Take 1,000 Units by mouth daily.    [provider]  levothyroxine (SYNTHROID) 100 MCG tablet Take 1 tablet (100 mcg total) by mouth daily before breakfast. 01/12/23   Carollee Leitz, MD  loratadine (CLARITIN) 10 MG tablet TAKE 1 TABLET BY MOUTH EVERY DAY AS NEEDED  FOR ALLERGY    [provider]  losartan (COZAAR) 25 MG tablet Take 1 tablet (25 mg total) by mouth daily. 01/12/23   Carollee Leitz, MD  saccharomyces boulardii (FLORASTOR) 250 MG capsule Take 1 capsule (250 mg total) by mouth daily. 01/12/23   Carollee Leitz, MD  traZODone (DESYREL) 50 MG tablet Take 50 mg by mouth at bedtime as needed. 02/27/21   [provider]  venlafaxine XR (EFFEXOR-XR) 150 MG 24 hr capsule Take 150 mg by mouth daily. 02/19/21   [provider]  VRAYLAR 1.5 MG capsule Take 1.5 mg by mouth. 12/31/22   [provider]  VYVANSE 10 MG capsule Take 10 mg by mouth daily. 08/16/21   [provider]    Family History Family History  Problem Relation Age of Onset   Depression Mother        postpartum   Hyperlipidemia Mother    Cancer Father        lung in remission small cell cancer former smoker    Depression Father    Diabetes Father    Hearing loss Father    Hypertension Father    Depression Sister    ADD / ADHD Sister    Kidney disease Brother    ADD / ADHD Brother    Diabetes Son        DM 1    Cancer Maternal Grandmother        breast    Early death Paternal Grandmother    Stroke Paternal Grandmother    Eating disorder Sister     Social History Social History   Tobacco Use   Smoking status: Never   Smokeless tobacco: Never  Vaping Use   Vaping Use: Never used  Substance Use Topics   Alcohol use: Yes    Comment: occas.   Drug use: No     Allergies   Patient has no known allergies.   Review of Systems Review of Systems   Physical Exam Triage Vital Signs ED Triage Vitals  Enc Vitals Group     BP      Pulse      Resp      Temp      Temp src      SpO2      Weight      Height      Head Circumference      Peak Flow      Pain Score      Pain Loc      Pain Edu?      Excl. in [place]?    No data found.  Updated Vital Signs LMP 12/29/2022 (Approximate)   Visual Acuity Right Eye Distance:   Left Eye Distance:   Bilateral Distance:    Right Eye Near:   Left Eye Near:    Bilateral Near:     Physical Exam Vitals reviewed.  Constitutional:      Appearance: She is well-developed. She is ill-appearing.  HENT:     Mouth/Throat:     Mouth: Mucous membranes are moist.     Pharynx: Posterior oropharyngeal erythema present. No oropharyngeal exudate.  Cardiovascular:     Rate and Rhythm: Normal rate and regular rhythm.     Heart sounds: Normal heart sounds.  Pulmonary:     Effort: Pulmonary effort is normal.     Breath sounds: Normal  breath sounds.  Lymphadenopathy:     Cervical: Cervical adenopathy present.  Skin:    General: Skin  is warm and dry.  Neurological:     General: No focal deficit present.     Mental Status: She is alert and oriented to person, place, and time.  Psychiatric:        Mood and Affect: Mood normal.        Behavior: Behavior normal.      UC Treatments / Results  Labs (all labs ordered are listed, but only abnormal results are displayed) Labs Reviewed - No data to display  EKG   Radiology No results found.  Procedures Procedures (including critical care time)  Medications Ordered in UC Medications - No data to display  Initial Impression / Assessment and Plan / UC Course  I have reviewed the triage vital signs and the nursing notes.  Pertinent labs & imaging results that were available during my care of the patient were reviewed by me and considered in my medical decision making (see chart for details).   Patient is afebrile here without recent antipyretics. Satting well on room air. Overall is ill appearing, well hydrated, without respiratory distress. Pulmonary exam is unremarkable.  Lungs CTAB without wheezing, rhonchi, rales.  Bilateral cervical, tonsillar and postauricular adenopathy is present.  Mild pharyngeal erythema without peritonsillar exudates.  Rapid strep is negative.  Suspect sinusitis of viral etiology however given past medical history and duration of symptoms, concern for possible bacterial involvement and will treat with both Augmentin and a course of prednisone.  Advising patient to follow-up with her primary care provider if her lymphadenopathy does not resolve within a few weeks or worsens.  Final Clinical Impressions(s) / UC Diagnoses   Final diagnoses:  None   Discharge Instructions   None    ED Prescriptions   None    PDMP not reviewed this encounter.   Rose Phi, [place] 01/20/23 1435

## 2023-01-20 NOTE — Discharge Instructions (Signed)
Follow up here or with your primary care provider if your symptoms are worsening or not improving.     

## 2023-01-20 NOTE — ED Triage Notes (Signed)
Pt reports sore throat x 6 days, bilateral ear pain, swollen and tender lymph nodes.  Lymph nodes more painful than throat. Unsure of fever. No cough, congestion. Taking Ibuprofen, last dose yesterday.

## 2023-01-21 DIAGNOSIS — F332 Major depressive disorder, recurrent severe without psychotic features: Secondary | ICD-10-CM | POA: Diagnosis not present

## 2023-01-21 DIAGNOSIS — F411 Generalized anxiety disorder: Secondary | ICD-10-CM | POA: Diagnosis not present

## 2023-01-25 NOTE — Patient Instructions (Incomplete)
It was a pleasure meeting you today. Thank you for allowing me to take part in your health care.  Our goals for today as we discussed include:  Start Micronor daily.  Take regularly at the same time each day.  Continue to use condoms for protection.   If you have any questions or concerns, please do not hesitate to call the office at [phone].  I look forward to our next visit and until then take care and stay safe.  Regards,   Carollee Leitz, MD   [facility]

## 2023-01-26 ENCOUNTER — Telehealth: Payer: BC Managed Care – PPO | Admitting: Family Medicine

## 2023-01-26 ENCOUNTER — Encounter: Payer: Self-pay | Admitting: Family Medicine

## 2023-01-26 VITALS — Ht 64.0 in | Wt 189.0 lb

## 2023-01-26 DIAGNOSIS — F332 Major depressive disorder, recurrent severe without psychotic features: Secondary | ICD-10-CM | POA: Diagnosis not present

## 2023-01-26 DIAGNOSIS — Z30011 Encounter for initial prescription of contraceptive pills: Secondary | ICD-10-CM | POA: Diagnosis not present

## 2023-01-26 DIAGNOSIS — Z309 Encounter for contraceptive management, unspecified: Secondary | ICD-10-CM | POA: Insufficient documentation

## 2023-01-26 DIAGNOSIS — F411 Generalized anxiety disorder: Secondary | ICD-10-CM | POA: Diagnosis not present

## 2023-01-26 MED ORDER — NORETHINDRONE 0.35 MG PO TABS: 1.0000 | ORAL_TABLET | Freq: Every day | ORAL | 3 refills | Status: DC

## 2023-01-26 NOTE — Assessment & Plan Note (Addendum)
Previously on Depo injection. No history of VTE/PE, MI.  Currently on menses, no indication for UPT. Start Micronor daily

## 2023-01-26 NOTE — Progress Notes (Signed)
Virtual Visit via Video note  I connected with Samantha Meyers on 01/26/23 at 1602 by video and verified that I am speaking with the correct person using two identifiers. Samantha Meyers is currently located at home and  is currently alone during visit. The provider, Carollee Leitz, MD is located in their office at time of visit.  I discussed the limitations, risks, security and privacy concerns of performing an evaluation and management service by video and the availability of in person appointments. I also discussed with the patient that there may be a patient responsible charge related to this service. The patient expressed understanding and agreed to proceed.  Subjective: PCP: Carollee Leitz, MD  Chief Complaint  Patient presents with   Medical Management of Chronic Issues    Follow upon. / meidcat    HPI Follow up to discuss contraception management  Would like to restart birth control.  Sexually active, using condoms.  Last sexual encounter 1 month ago. Started menses 03/30.  No concern for pregnancy. Prefers to use OCP daily to stop menses.  Has history of heavy periods.  Denies any history of VTE/PE.  Previous on Depo injection but had issues with each injection with rush of hormones.  Wants to remain estrogen free.  Currently on Losartan and BP well controlled.  Last PAP 12/01/2020 NILM, HPV negative. OBGYN at [facility]. ROS: Per HPI  Current Outpatient Medications:    albuterol (PROAIR HFA) 108 (90 Base) MCG/ACT inhaler, Inhale 1-2 puffs into the lungs every 4 (four) hours as needed for wheezing or shortness of breath., Disp: 18 g, Rfl: 11   amoxicillin-clavulanate (AUGMENTIN) 875-125 MG tablet, Take 1 tablet by mouth every 12 (twelve) hours., Disp: 14 tablet, Rfl: 0   amphetamine-dextroamphetamine (ADDERALL) 5 MG tablet, SMARTSIG:1 Tablet(s) By Mouth Every Evening, Disp: , Rfl:    cholecalciferol (VITAMIN D3) 25 MCG (1000 UNIT) tablet, Take 1,000 Units by  mouth daily., Disp: , Rfl:    levothyroxine (SYNTHROID) 100 MCG tablet, Take 1 tablet (100 mcg total) by mouth daily before breakfast., Disp: 90 tablet, Rfl: 3   loratadine (CLARITIN) 10 MG tablet, TAKE 1 TABLET BY MOUTH EVERY DAY AS NEEDED FOR ALLERGY, Disp: , Rfl:    losartan (COZAAR) 25 MG tablet, Take 1 tablet (25 mg total) by mouth daily., Disp: 90 tablet, Rfl: 3   norethindrone (ORTHO MICRONOR) 0.35 MG tablet, Take 1 tablet (0.35 mg total) by mouth daily., Disp: 90 tablet, Rfl: 3   saccharomyces boulardii (FLORASTOR) 250 MG capsule, Take 1 capsule (250 mg total) by mouth daily., Disp: 90 capsule, Rfl: 0   traZODone (DESYREL) 50 MG tablet, Take 50 mg by mouth at bedtime as needed., Disp: , Rfl:    venlafaxine XR (EFFEXOR-XR) 150 MG 24 hr capsule, Take 150 mg by mouth daily., Disp: , Rfl:    VYVANSE 10 MG capsule, Take 10 mg by mouth daily., Disp: , Rfl:   Observations/Objective: Physical Exam Pulmonary:     Effort: Pulmonary effort is normal.  Neurological:     Mental Status: She is alert and oriented to person, place, and time. Mental status is at baseline.  Psychiatric:        Mood and Affect: Mood normal.        Behavior: Behavior normal.        Thought Content: Thought content normal.        Judgment: Judgment normal.    Assessment and Plan: Encounter for initial prescription of contraceptive pills Assessment &  Plan: Previously on Depo injection. No history of VTE/PE, MI.  Currently on menses, no indication for UPT. Start Micronor daily   Orders: -     Norethindrone; Take 1 tablet (0.35 mg total) by mouth daily.  Dispense: 90 tablet; Refill: 3    Follow Up Instructions: Return if symptoms worsen or fail to improve.   I discussed the assessment and treatment plan with the patient. The patient was provided an opportunity to ask questions and all were answered. The patient agreed with the plan and demonstrated an understanding of the instructions.   The patient was  advised to call back or seek an in-person evaluation if the symptoms worsen or if the condition fails to improve as anticipated.  The above assessment and management plan was discussed with the patient. The patient verbalized understanding of and has agreed to the management plan. Patient is aware to call the clinic if symptoms persist or worsen. Patient is aware when to return to the clinic for a follow-up visit. Patient educated on when it is appropriate to go to the emergency department.   PDMP reviewed  Carollee Leitz, MD

## 2023-02-04 DIAGNOSIS — F411 Generalized anxiety disorder: Secondary | ICD-10-CM | POA: Diagnosis not present

## 2023-02-04 DIAGNOSIS — F332 Major depressive disorder, recurrent severe without psychotic features: Secondary | ICD-10-CM | POA: Diagnosis not present

## 2023-03-02 DIAGNOSIS — F332 Major depressive disorder, recurrent severe without psychotic features: Secondary | ICD-10-CM | POA: Diagnosis not present

## 2023-03-02 DIAGNOSIS — F411 Generalized anxiety disorder: Secondary | ICD-10-CM | POA: Diagnosis not present

## 2023-03-09 DIAGNOSIS — F330 Major depressive disorder, recurrent, mild: Secondary | ICD-10-CM | POA: Diagnosis not present

## 2023-03-09 DIAGNOSIS — F411 Generalized anxiety disorder: Secondary | ICD-10-CM | POA: Diagnosis not present

## 2023-03-09 DIAGNOSIS — F900 Attention-deficit hyperactivity disorder, predominantly inattentive type: Secondary | ICD-10-CM | POA: Diagnosis not present

## 2023-04-07 ENCOUNTER — Encounter: Payer: Self-pay | Admitting: Family

## 2023-04-07 ENCOUNTER — Ambulatory Visit: Payer: BC Managed Care – PPO | Admitting: Family

## 2023-04-07 ENCOUNTER — Telehealth: Payer: Self-pay | Admitting: Family

## 2023-04-07 VITALS — BP 128/82 | HR 83 | Temp 97.9°F | Ht 63.0 in | Wt 200.6 lb

## 2023-04-07 DIAGNOSIS — Z6835 Body mass index (BMI) 35.0-35.9, adult: Secondary | ICD-10-CM | POA: Diagnosis not present

## 2023-04-07 DIAGNOSIS — R7309 Other abnormal glucose: Secondary | ICD-10-CM | POA: Diagnosis not present

## 2023-04-07 DIAGNOSIS — Z30011 Encounter for initial prescription of contraceptive pills: Secondary | ICD-10-CM

## 2023-04-07 DIAGNOSIS — E669 Obesity, unspecified: Secondary | ICD-10-CM

## 2023-04-07 LAB — POCT GLYCOSYLATED HEMOGLOBIN (HGB A1C): Hemoglobin A1C: 5.4 % (ref 4.0–5.6)

## 2023-04-07 NOTE — Assessment & Plan Note (Signed)
Patient stopped norethindrone after weight gain.  Her partner is scheduled for vasectomy in 2 months time.

## 2023-04-07 NOTE — Telephone Encounter (Addendum)
Called Dr Philis Nettle  office Phone: [phone]   spoke to [facility] and she took the message below  and sent to Dr. Sebastian Ache an will have them reach  to [facility]. Cell # given..[phone]. Office # given as well..[phone]

## 2023-04-07 NOTE — Telephone Encounter (Signed)
Call pt oncologist( contact below)  Pt has h/o hodgkins lymphoma  Is she okay to start [facility] for weight loss?   Concern with black box warning in regards to risk of medullary thyroid cancer and patient's increase risk of chest and neck cancer after radiation.   Nurse or Dr Philis Nettle can call my cell is easier  [phone] or the office. Whichever is fine.    Norlene Campbell, MD  [address] [facility] [address]  [place], [place] [address]  Phone: [phone]

## 2023-04-07 NOTE — Assessment & Plan Note (Addendum)
We discussed GLP-1 agonist versus metformin.  Patient has history of Hodgkin's lymphoma and follows with [facility] survivorship, Dr Sebastian Ache.  She has a history of neck and chest radiation we discussed blackbox warning as it relates to medullary thyroid cancer, multiple endocrine neoplasia.  I have placed a call to Dr Sebastian Ache in regards to starting [facility] to ensure no contraindication.  I went ahead and counseled on blackbox warning, mechanism action, titration as it relates to [facility].  Counseled on Wegovy not being safe in pregnancy.  Of note her partner is scheduled for vasectomy

## 2023-04-07 NOTE — Patient Instructions (Signed)
After I speak with [facility], I will call in [facility].  Alternatively, as discussed we will consider metformin if needed  We have discussed starting non insulin daily injectable medication called Wegovy  which is a glucagon like peptide (GLP 1) agonist and works by delaying gastric emptying and increasing insulin secretion.It is given once per week. Most patients see significant weight loss with this drug class.   You may NOT take either medication if you or your family has history of thyroid, parathyroid, OR adrenal cancer. Please confirm you and your family does NOT have this history as this drug class has black box warning on this medication for that reason.   Please follow  directions on prescription and slowly increase from 0.25mg  Lar[place] once per week ;stay here for 4 weeks. You may then increase to 0.5mg  [place] once per week and stay there for 4 weeks.  We can slowly titrate further at follow up with goal of no more than 1-2 lbs weight loss per week.  Semaglutide [facility])  Dose (mg) Once Weekly Titration:    If a dose is not tolerated, consider delaying further dose increases for  another 4 weeks.  If you are actively losing weight on a dose, do not increase medication.   Weeks 1 through 4  0.25 mg once weekly  Weeks 5 through 8  0.5 mg once weekly  Weeks 9 though 12  1 mg once weekly  Weeks 13 through 16  1.7 mg once weekly   Semaglutide Injection (Weight Management) What is this medication? SEMAGLUTIDE (SEM a GLOO tide) promotes weight loss. It may also be used to maintain weight loss. It works by decreasing appetite. Changes to diet and exercise are often combined with this medication. This medicine may be used for other purposes; ask your health care provider or pharmacist if you have questions. COMMON BRAND NAME(S): EAVWUJ What should I tell my care team before I take this medication? They need to know if you have any of these conditions: Endocrine tumors (MEN 2) or if someone in  your family had these tumors Eye disease, vision problems Gallbladder disease History of depression or mental health disease History of pancreatitis Kidney disease Stomach or intestine problems Suicidal thoughts, plans, or attempt; a previous suicide attempt by you or a family member Thyroid cancer or if someone in your family had thyroid cancer An unusual or allergic reaction to semaglutide, other medications, foods, dyes, or preservatives Pregnant or trying to get pregnant Breast-feeding How should I use this medication? This medication is injected under the skin. You will be taught how to prepare and give it. Take it as directed on the prescription label. It is given once every week (every 7 days). Keep taking it unless your care team tells you to stop. It is important that you put your used needles and pens in a special sharps container. Do not put them in a trash can. If you do not have a sharps container, call your pharmacist or care team to get one. A special MedGuide will be given to you by the pharmacist with each prescription and refill. Be sure to read this information carefully each time. This medication comes with INSTRUCTIONS FOR USE. Ask your pharmacist for directions on how to use this medication. Read the information carefully. Talk to your pharmacist or care team if you have questions. Talk to your care team about the use of this medication in children. While it may be prescribed for children as young as 66  years for selected conditions, precautions do apply. Overdosage: If you think you have taken too much of this medicine contact a poison control center or emergency room at once. NOTE: This medicine is only for you. Do not share this medicine with others. What if I miss a dose? If you miss a dose and the next scheduled dose is more than 2 days away, take the missed dose as soon as possible. If you miss a dose and the next scheduled dose is less than 2 days away, do not take  the missed dose. Take the next dose at your regular time. Do not take double or extra doses. If you miss your dose for 2 weeks or more, take the next dose at your regular time or call your care team to talk about how to restart this medication. What may interact with this medication? Insulin and other medications for diabetes This list may not describe all possible interactions. Give your health care provider a list of all the medicines, herbs, non-prescription drugs, or dietary supplements you use. Also tell them if you smoke, drink alcohol, or use illegal drugs. Some items may interact with your medicine. What should I watch for while using this medication? Visit your care team for regular checks on your progress. It may be some time before you see the benefit from this medication. Drink plenty of fluids while taking this medication. Check with your care team if you have severe diarrhea, nausea, and vomiting, or if you sweat a lot. The loss of too much body fluid may make it dangerous for you to take this medication. This medication may affect blood sugar levels. Ask your care team if changes in diet or medications are needed if you have diabetes. If you or your family notice any changes in your behavior, such as new or worsening depression, thoughts of harming yourself, anxiety, other unusual or disturbing thoughts, or memory loss, call your care team right away. Women should inform their care team if they wish to become pregnant or think they might be pregnant. Losing weight while pregnant is not advised and may cause harm to the unborn child. Talk to your care team for more information. What side effects may I notice from receiving this medication? Side effects that you should report to your care team as soon as possible: Allergic reactions--skin rash, itching, hives, swelling of the face, lips, tongue, or throat Change in vision Dehydration--increased thirst, dry mouth, feeling faint or  lightheaded, headache, dark yellow or brown urine Gallbladder problems--severe stomach pain, nausea, vomiting, fever Heart palpitations--rapid, pounding, or irregular heartbeat Kidney injury--decrease in the amount of urine, swelling of the ankles, hands, or feet Pancreatitis--severe stomach pain that spreads to your back or gets worse after eating or when touched, fever, nausea, vomiting Thoughts of suicide or self-harm, worsening mood, feelings of depression Thyroid cancer--new mass or lump in the neck, pain or trouble swallowing, trouble breathing, hoarseness Side effects that usually do not require medical attention (report to your care team if they continue or are bothersome): Diarrhea Loss of appetite Nausea Stomach pain Vomiting This list may not describe all possible side effects. Call your doctor for medical advice about side effects. You may report side effects to FDA at 1-800-FDA-1088. Where should I keep my medication? Keep out of the reach of children and pets. Refrigeration (preferred): Store in the refrigerator. Do not freeze. Keep this medication in the original container until you are ready to take it. Get rid of any unused  medication after the expiration date. Room temperature: If needed, prior to cap removal, the pen can be stored at room temperature for up to 28 days. Protect from light. If it is stored at room temperature, get rid of any unused medication after 28 days or after it expires, whichever is first. It is important to get rid of the medication as soon as you no longer need it or it is expired. You can do this in two ways: Take the medication to a medication take-back program. Check with your pharmacy or law enforcement to find a location. If you cannot return the medication, follow the directions in the MedGuide. NOTE: This sheet is a summary. It may not cover all possible information. If you have questions about this medicine, talk to your doctor, pharmacist, or  health care provider.  2023 Elsevier/Gold Standard (2020-12-27 00:00:00)

## 2023-04-07 NOTE — Progress Notes (Signed)
Assessment & Plan:  Elevated glucose -     POCT glycosylated hemoglobin (Hb A1C)  Encounter for initial prescription of contraceptive pills Assessment & Plan: Patient stopped norethindrone after weight gain.  Her partner is scheduled for vasectomy in 2 months time.   Obesity (BMI 35.0-39.9 without comorbidity) Assessment & Plan: We discussed GLP-1 agonist versus metformin.  Patient has history of Hodgkin's lymphoma and follows with [facility] survivorship, Dr Sebastian Ache.  She has a history of neck and chest radiation we discussed blackbox warning as it relates to medullary thyroid cancer, multiple endocrine neoplasia.  I have placed a call to Dr Sebastian Ache in regards to starting [facility] to ensure no contraindication.  I went ahead and counseled on blackbox warning, mechanism action, titration as it relates to [facility].  Counseled on Wegovy not being safe in pregnancy.  Of note her partner is scheduled for vasectomy      Return precautions given.   Risks, benefits, and alternatives of the medications and treatment plan prescribed today were discussed, and patient expressed understanding.   Education regarding symptom management and diagnosis given to patient on AVS either electronically or printed.  No follow-ups on file.  Rennie Plowman, FNP  Subjective:    Patient ID: Dolan Amen, female    DOB: 1981/04/03, 42 y.o.   MRN: [id number]  CC: Xochitl Egle is a 42 y.o. female who presents today for follow up.   HPI: She would like to discuss weight loss medication.  She has been reading about Wegovy.  She has tried various diets including Noom, weight watchers without lasting results.  She has been on a diet most of her life.        Previous patient of Dr Shirlee Latch.  She is established with Dr. Clent Ridges.   Last seen Dr. Clent Ridges 01/26/2023 to discuss contraceptive management. Started on norethindrone and since stopped as she gained 15 lbs.    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 She works out daily, using walking her dog and weights training 2-3 days per week.   She follows with cancer survivorship at [facility] , Dr Sebastian Ache , whom she sees annually.  Family h/o DM type 2.   Elevated glucose in pregnancy   Partner is scheduled for vasectomy  No personal or family history of thyroid cancer,MEN   Allergies: Patient has no known allergies. Current Outpatient Medications on File Prior to Visit  Medication Sig Dispense Refill   albuterol (PROAIR HFA) 108 (90 Base) MCG/ACT inhaler Inhale 1-2 puffs into the lungs every 4 (four) hours as needed for wheezing or shortness of breath. 18 g 11   amphetamine-dextroamphetamine (ADDERALL) 5 MG tablet SMARTSIG:1 Tablet(s) By Mouth Every Evening     cholecalciferol (VITAMIN D3) 25 MCG (1000 UNIT) tablet Take 1,000 Units by mouth daily.     levothyroxine (SYNTHROID) 100 MCG tablet Take 1 tablet (100 mcg total) by mouth daily before breakfast. 90 tablet 3   loratadine (CLARITIN) 10 MG tablet TAKE 1 TABLET BY MOUTH EVERY DAY AS NEEDED FOR ALLERGY     losartan (COZAAR) 25 MG tablet Take 1 tablet (25 mg total) by mouth daily. 90 tablet 3   saccharomyces boulardii (FLORASTOR) 250 MG capsule Take 1 capsule (250 mg total) by mouth daily. 90 capsule 0   traZODone (DESYREL) 50 MG tablet Take 50 mg by mouth at bedtime as needed.  venlafaxine XR (EFFEXOR-XR) 150 MG 24 hr capsule Take 150 mg by mouth daily.     VYVANSE 10 MG capsule Take 10 mg by mouth daily.     No current facility-administered medications on file prior to visit.    Review of Systems  Constitutional:  Negative for chills and fever.  Respiratory:  Negative for cough.   Cardiovascular:  Negative for chest pain and palpitations.  Gastrointestinal:  Negative for nausea and vomiting.      Objective:    BP 128/82   Pulse 83   Temp 97.9 F (36.6 C)  (Oral)   Ht 5\' 3"  (1.6 m)   Wt 200 lb 9.6 oz (91 kg)   LMP  (LMP Unknown)   SpO2 98%   BMI 35.53 kg/m  BP Readings from Last 3 Encounters:  04/07/23 128/82  01/20/23 138/71  01/12/23 118/78   Wt Readings from Last 3 Encounters:  04/07/23 200 lb 9.6 oz (91 kg)  01/26/23 189 lb (85.7 kg)  01/12/23 190 lb (86.2 kg)    Physical Exam Vitals reviewed.  Constitutional:      Appearance: She is well-developed.  Eyes:     Conjunctiva/sclera: Conjunctivae normal.  Neck:     Thyroid: No thyroid mass or thyromegaly.  Cardiovascular:     Rate and Rhythm: Normal rate and regular rhythm.     Pulses: Normal pulses.     Heart sounds: Normal heart sounds.  Pulmonary:     Effort: Pulmonary effort is normal.     Breath sounds: Normal breath sounds. No wheezing, rhonchi or rales.  Lymphadenopathy:     Head:     Right side of head: No submental, submandibular, tonsillar, preauricular, posterior auricular or occipital adenopathy.     Left side of head: No submental, submandibular, tonsillar, preauricular, posterior auricular or occipital adenopathy.     Cervical: No cervical adenopathy.  Skin:    General: Skin is warm and dry.  Neurological:     Mental Status: She is alert.  Psychiatric:        Speech: Speech normal.        Behavior: Behavior normal.        Thought Content: Thought content normal.

## 2023-04-10 ENCOUNTER — Other Ambulatory Visit (HOSPITAL_COMMUNITY): Payer: Self-pay

## 2023-04-10 ENCOUNTER — Telehealth: Payer: Self-pay

## 2023-04-10 MED ORDER — WEGOVY 0.25 MG/0.5ML ~~LOC~~ SOAJ: 0.2500 mg | SUBCUTANEOUS | 2 refills | Status: DC

## 2023-04-10 NOTE — Addendum Note (Signed)
Addended by: Allegra Grana on: 04/10/2023 11:27 AM   Modules accepted: Orders

## 2023-04-10 NOTE — Telephone Encounter (Signed)
Call pt   I received nice voicemail from Dr Philis Nettle NP, KIm Eustace Moore.   She shared with him my concern in regards to starting [facility] with a history of Hodgkin's lymphoma.  He did not have any concerns with starting medication.  I have sent her a wegovy  Please advise patient to review after visit summary this week t in regards to [facility] titration, side effects  Please make her a follow-up in 6 weeks time with PCP

## 2023-04-10 NOTE — Telephone Encounter (Signed)
Pharmacy Patient Advocate Encounter   Received notification from CoverMyMeds that prior authorization for [facility] is required/requested.   Insurance verification completed.   The patient is insured through [facility] .     PA submitted to [facility] via PA options: CoverMyMeds Key/confirmation #/EOC [id number] Status is pending

## 2023-04-13 NOTE — Telephone Encounter (Signed)
PA has been APPROVED from 04/13/2023-08/14/2023

## 2023-04-27 ENCOUNTER — Ambulatory Visit: Payer: BC Managed Care – PPO | Admitting: Family Medicine

## 2023-04-27 VITALS — BP 128/88 | HR 77 | Temp 98.2°F | Ht 63.0 in | Wt 196.0 lb

## 2023-04-27 DIAGNOSIS — M26621 Arthralgia of right temporomandibular joint: Secondary | ICD-10-CM

## 2023-04-27 DIAGNOSIS — H6591 Unspecified nonsuppurative otitis media, right ear: Secondary | ICD-10-CM

## 2023-04-27 MED ORDER — CELECOXIB 50 MG PO CAPS: 50.0000 mg | ORAL_CAPSULE | Freq: Two times a day (BID) | ORAL | 0 refills | Status: AC

## 2023-04-27 MED ORDER — OMEPRAZOLE 20 MG PO CPDR: 20.0000 mg | DELAYED_RELEASE_CAPSULE | Freq: Every day | ORAL | 0 refills | Status: DC

## 2023-04-27 MED ORDER — FLUTICASONE PROPIONATE 50 MCG/ACT NA SUSP: 2.0000 | Freq: Every day | NASAL | 1 refills | Status: DC

## 2023-04-27 NOTE — Progress Notes (Signed)
SUBJECTIVE:   Chief Complaint  Patient presents with   Ear Pain    Right ear, internal pain, hurts when she bits down.   Temporomandibular Joint Pain    For about year but seems to be getting worse    HPI Presents to clinic for concern of right ear pain and worsening TMJ pain  Right ear pain Symptoms started 4 days ago. Was swimming with kids.  Denies any fevers, decreased hearing, ear discharge, tinnitus, or sense of off balance. No recent URI.  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  Pain has been getting worse over the past few months.  Has not been using bite guard or or performing TMJ exercises. No conservative treatment to date.    PERTINENT PMH / PSH: HTN Hypothyroid Mood Disorder H/O Hodgkins lymphoma in remission   OBJECTIVE:  BP 128/88 (BP Location: Left Arm, Patient Position: Sitting, Cuff Size: Normal)   Pulse 77   Temp 98.2 F (36.8 C) (Oral)   Ht 5\' 3"  (1.6 m)   Wt 196 lb (88.9 kg)   LMP  (LMP Unknown)   SpO2 97%   BMI 34.72 kg/m    Physical Exam Constitutional:      General: She is not in acute distress.    Appearance: She is obese. She is not ill-appearing or toxic-appearing.  HENT:     Head: Normocephalic.     Jaw: Tenderness (right TMJ Click) present. No swelling.     Right Ear: Ear canal and external ear normal. A middle ear effusion is present.     Left Ear: Tympanic membrane, ear canal and external ear normal.     Nose: Nose normal.     Mouth/Throat:     Mouth: Mucous membranes are moist.  Eyes:     Conjunctiva/sclera: Conjunctivae normal.  Lymphadenopathy:     Cervical: No cervical adenopathy.  Neurological:     Mental Status: She is alert.        04/27/2023   10:58 AM 04/07/2023    9:44 AM 01/26/2023    3:58 PM 01/12/2023    9:44 AM 05/09/2022    8:24 AM  Depression screen PHQ 2/9  Decreased Interest 0 1 0 1 1  Down, Depressed, Hopeless 1 1 0 1 0  PHQ - 2 Score 1 2 0 2 1  Altered sleeping 1 1  2     Tired, decreased energy 1 1  2    Change in appetite 0 1  2   Feeling bad or failure about yourself  0 0  2   Trouble concentrating 0 0  2   Moving slowly or fidgety/restless 0 0  0   Suicidal thoughts 0 0  0   PHQ-9 Score 3 5  12    Difficult doing work/chores  Somewhat difficult  Somewhat difficult       04/27/2023   10:59 AM 04/07/2023    9:44 AM 01/26/2023    3:58 PM 01/12/2023    9:44 AM  GAD 7 : Generalized Anxiety Score  Nervous, Anxious, on Edge 1 1 0 2  Control/stop worrying 1 1 0 2  Worry too much - different things 1 1 0 2  Trouble relaxing 1 1 0 3  Restless 0 0 0   Easily annoyed or irritable 1 1 0 2  Afraid - awful might happen 0 0 0 2  Total GAD 7 Score 5 5 0   Anxiety Difficulty Somewhat difficult Somewhat  difficult Not difficult at all Somewhat difficult    ASSESSMENT/PLAN:  Right otitis media with effusion Assessment & Plan: Suspect environmental vs early viral etiology.  No fevers, discharge, decrease in hearing or tinnitus.  Pain could be referred from TMJ Will trial Flonase 2 sprays daily and Zyrtec 10 mg daily.   If no improvement or development of fevers, discharge, increase in pain or edema, patient aware to call MD and will initiate antibiotic therapy   Orders: -     Celecoxib; Take 1 capsule (50 mg total) by mouth 2 (two) times daily for 10 days.  Dispense: 20 capsule; Refill: 0  Arthralgia of right temporomandibular joint Assessment & Plan: History of Rt TMJ. Start Celebrex 50 mg BID for 10 days.  Can use as needed after 5 days of continuous therapy Start Prilosec 20 mg daily while on NSAIDS Recommend follow up with Dentist for discussion of dental night guard TMJ exercises provided  Orders: -     Celecoxib; Take 1 capsule (50 mg total) by mouth 2 (two) times daily for 10 days.  Dispense: 20 capsule; Refill: 0   PDMP reviewed  Return if symptoms worsen or fail to improve, for PCP.  Dana Allan, MD

## 2023-04-27 NOTE — Patient Instructions (Addendum)
It was a pleasure meeting you today. Thank you for allowing me to take part in your health care.  Our goals for today as we discussed include:   TMJ exercises  Use Flonase 2 sprays each nostril once a day Take Claritin 10 mg daily Start Celebrex 50 mg two times a day for pain x 5 days then as needed Start Omeprazole 20 mg daily while on Celebrex  If develops fevers, decrease in hearing, ear discharge, ringing in ear or worsening pain notify MD    If you have any questions or concerns, please do not hesitate to call the office at [phone].  I look forward to our next visit and until then take care and stay safe.  Regards,   Dana Allan, MD   [facility]

## 2023-05-13 DIAGNOSIS — F900 Attention-deficit hyperactivity disorder, predominantly inattentive type: Secondary | ICD-10-CM | POA: Diagnosis not present

## 2023-05-13 DIAGNOSIS — F411 Generalized anxiety disorder: Secondary | ICD-10-CM | POA: Diagnosis not present

## 2023-05-13 DIAGNOSIS — F330 Major depressive disorder, recurrent, mild: Secondary | ICD-10-CM | POA: Diagnosis not present

## 2023-05-14 ENCOUNTER — Encounter: Payer: Self-pay | Admitting: Family

## 2023-05-16 ENCOUNTER — Other Ambulatory Visit: Payer: Self-pay | Admitting: Family

## 2023-05-16 DIAGNOSIS — E669 Obesity, unspecified: Secondary | ICD-10-CM

## 2023-05-16 MED ORDER — WEGOVY 0.5 MG/0.5ML ~~LOC~~ SOAJ: 0.5000 mg | SUBCUTANEOUS | 1 refills | Status: DC

## 2023-05-18 NOTE — Telephone Encounter (Signed)
NOTED

## 2023-05-19 ENCOUNTER — Other Ambulatory Visit: Payer: Self-pay | Admitting: Family Medicine

## 2023-05-19 DIAGNOSIS — H6591 Unspecified nonsuppurative otitis media, right ear: Secondary | ICD-10-CM

## 2023-05-19 DIAGNOSIS — M26621 Arthralgia of right temporomandibular joint: Secondary | ICD-10-CM

## 2023-05-23 ENCOUNTER — Encounter: Payer: Self-pay | Admitting: Family Medicine

## 2023-05-23 DIAGNOSIS — M26621 Arthralgia of right temporomandibular joint: Secondary | ICD-10-CM | POA: Insufficient documentation

## 2023-05-23 DIAGNOSIS — H6591 Unspecified nonsuppurative otitis media, right ear: Secondary | ICD-10-CM | POA: Insufficient documentation

## 2023-05-23 NOTE — Assessment & Plan Note (Addendum)
History of Rt TMJ. Start Celebrex 50 mg BID for 10 days.  Can use as needed after 5 days of continuous therapy Start Prilosec 20 mg daily while on NSAIDS Recommend follow up with Dentist for discussion of dental night guard TMJ exercises provided

## 2023-05-23 NOTE — Assessment & Plan Note (Signed)
Suspect environmental vs early viral etiology.  No fevers, discharge, decrease in hearing or tinnitus.  Pain could be referred from TMJ Will trial Flonase 2 sprays daily and Zyrtec 10 mg daily.   If no improvement or development of fevers, discharge, increase in pain or edema, patient aware to call MD and will initiate antibiotic therapy

## 2023-05-25 DIAGNOSIS — F330 Major depressive disorder, recurrent, mild: Secondary | ICD-10-CM | POA: Diagnosis not present

## 2023-05-25 DIAGNOSIS — F411 Generalized anxiety disorder: Secondary | ICD-10-CM | POA: Diagnosis not present

## 2023-05-25 DIAGNOSIS — F900 Attention-deficit hyperactivity disorder, predominantly inattentive type: Secondary | ICD-10-CM | POA: Diagnosis not present

## 2023-06-04 DIAGNOSIS — F332 Major depressive disorder, recurrent severe without psychotic features: Secondary | ICD-10-CM | POA: Diagnosis not present

## 2023-06-04 DIAGNOSIS — F411 Generalized anxiety disorder: Secondary | ICD-10-CM | POA: Diagnosis not present

## 2023-06-08 ENCOUNTER — Other Ambulatory Visit: Payer: Self-pay

## 2023-06-09 ENCOUNTER — Other Ambulatory Visit: Payer: Self-pay | Admitting: Family

## 2023-06-09 DIAGNOSIS — E669 Obesity, unspecified: Secondary | ICD-10-CM

## 2023-06-09 MED ORDER — WEGOVY 1 MG/0.5ML ~~LOC~~ SOAJ: 1.0000 mg | SUBCUTANEOUS | 2 refills | Status: DC

## 2023-06-16 ENCOUNTER — Other Ambulatory Visit (HOSPITAL_COMMUNITY): Payer: Self-pay | Admitting: Urology

## 2023-06-16 DIAGNOSIS — N135 Crossing vessel and stricture of ureter without hydronephrosis: Secondary | ICD-10-CM

## 2023-06-24 ENCOUNTER — Encounter (HOSPITAL_COMMUNITY): Payer: BC Managed Care – PPO

## 2023-06-24 DIAGNOSIS — F330 Major depressive disorder, recurrent, mild: Secondary | ICD-10-CM | POA: Diagnosis not present

## 2023-06-24 DIAGNOSIS — F411 Generalized anxiety disorder: Secondary | ICD-10-CM | POA: Diagnosis not present

## 2023-06-24 DIAGNOSIS — F900 Attention-deficit hyperactivity disorder, predominantly inattentive type: Secondary | ICD-10-CM | POA: Diagnosis not present

## 2023-07-01 ENCOUNTER — Encounter (HOSPITAL_COMMUNITY): Payer: Self-pay

## 2023-07-01 ENCOUNTER — Encounter (HOSPITAL_COMMUNITY): Payer: BC Managed Care – PPO

## 2023-07-13 ENCOUNTER — Other Ambulatory Visit: Payer: Self-pay | Admitting: Family

## 2023-07-13 DIAGNOSIS — E669 Obesity, unspecified: Secondary | ICD-10-CM

## 2023-07-13 MED ORDER — WEGOVY 1.7 MG/0.75ML ~~LOC~~ SOAJ: 1.7000 mg | SUBCUTANEOUS | 3 refills | Status: DC

## 2023-07-29 DIAGNOSIS — F411 Generalized anxiety disorder: Secondary | ICD-10-CM | POA: Diagnosis not present

## 2023-07-29 DIAGNOSIS — F332 Major depressive disorder, recurrent severe without psychotic features: Secondary | ICD-10-CM | POA: Diagnosis not present

## 2023-08-05 DIAGNOSIS — F332 Major depressive disorder, recurrent severe without psychotic features: Secondary | ICD-10-CM | POA: Diagnosis not present

## 2023-08-05 DIAGNOSIS — F411 Generalized anxiety disorder: Secondary | ICD-10-CM | POA: Diagnosis not present

## 2023-08-12 DIAGNOSIS — F332 Major depressive disorder, recurrent severe without psychotic features: Secondary | ICD-10-CM | POA: Diagnosis not present

## 2023-08-12 DIAGNOSIS — F411 Generalized anxiety disorder: Secondary | ICD-10-CM | POA: Diagnosis not present

## 2023-08-17 ENCOUNTER — Encounter: Payer: Self-pay | Admitting: Family

## 2023-08-17 DIAGNOSIS — F900 Attention-deficit hyperactivity disorder, predominantly inattentive type: Secondary | ICD-10-CM | POA: Diagnosis not present

## 2023-08-17 DIAGNOSIS — F330 Major depressive disorder, recurrent, mild: Secondary | ICD-10-CM | POA: Diagnosis not present

## 2023-08-17 DIAGNOSIS — F411 Generalized anxiety disorder: Secondary | ICD-10-CM | POA: Diagnosis not present

## 2023-08-20 ENCOUNTER — Other Ambulatory Visit (HOSPITAL_COMMUNITY): Payer: Self-pay

## 2023-08-21 NOTE — Telephone Encounter (Signed)
Patient called for an update on Med refill. Patient states has been waiting for a response since 10/21.

## 2023-08-26 DIAGNOSIS — F332 Major depressive disorder, recurrent severe without psychotic features: Secondary | ICD-10-CM | POA: Diagnosis not present

## 2023-08-26 DIAGNOSIS — F411 Generalized anxiety disorder: Secondary | ICD-10-CM | POA: Diagnosis not present

## 2023-08-31 ENCOUNTER — Other Ambulatory Visit (HOSPITAL_COMMUNITY): Payer: Self-pay

## 2023-09-02 ENCOUNTER — Ambulatory Visit: Payer: BC Managed Care – PPO | Admitting: Family Medicine

## 2023-09-03 ENCOUNTER — Telehealth: Payer: Self-pay

## 2023-09-03 ENCOUNTER — Other Ambulatory Visit (HOSPITAL_COMMUNITY): Payer: Self-pay

## 2023-09-03 NOTE — Telephone Encounter (Signed)
Pharmacy Patient Advocate Encounter   Received notification from CoverMyMeds that prior authorization for [facility] 1.7MG /0.75ML auto-injectors is required/requested.   Insurance verification completed.   The patient is insured through [facility] .   Per test claim: PA required; PA started via CoverMyMeds. KEY [id number] . Waiting for clinical questions to populate.

## 2023-09-04 NOTE — Telephone Encounter (Signed)
Clinical questions answered and pa submitted

## 2023-09-04 NOTE — Telephone Encounter (Signed)
Clinical questions populated. Need pt's current weight

## 2023-09-04 NOTE — Telephone Encounter (Signed)
Noted  

## 2023-09-07 ENCOUNTER — Ambulatory Visit: Payer: BC Managed Care – PPO | Admitting: Family Medicine

## 2023-09-07 ENCOUNTER — Encounter: Payer: Self-pay | Admitting: Family Medicine

## 2023-09-07 VITALS — BP 114/68 | HR 88 | Temp 98.0°F | Resp 16 | Ht 63.0 in | Wt 184.1 lb

## 2023-09-07 DIAGNOSIS — Z6832 Body mass index (BMI) 32.0-32.9, adult: Secondary | ICD-10-CM | POA: Diagnosis not present

## 2023-09-07 DIAGNOSIS — I10 Essential (primary) hypertension: Secondary | ICD-10-CM

## 2023-09-07 DIAGNOSIS — E669 Obesity, unspecified: Secondary | ICD-10-CM

## 2023-09-07 DIAGNOSIS — Z23 Encounter for immunization: Secondary | ICD-10-CM | POA: Diagnosis not present

## 2023-09-07 NOTE — Progress Notes (Unsigned)
SUBJECTIVE:   Chief Complaint  Patient presents with   Medication Consultation    Worried about going on 1.7 when she been off since Oct 20,2024   HPI Presents for weight loss medication management  Discussed the use of AI scribe software for clinical note transcription with the patient, who gave verbal consent to proceed.  History of Present Illness The patient, who has been on [facility] for approximately five and a half months, reports a successful weight loss journey. They started on a dose of 0.25, gradually increasing to 1.7 as per the recommended schedule. After four months on the medication, they had lost 22 pounds, averaging a weight loss of 1.5 to 1.6 pounds per week.  However, the patient encountered difficulties when attempting to refill their prescription for the 1.7 dose. Despite their request and subsequent communication with the pharmacy, the insurance company did not approve the refill, leading to an interruption in the patient's medication regimen. As a result, the patient has been off [facility] since October 20th.  During the period off the medication, the patient experienced sickness for about a week, which they attribute to withdrawal from the 1.7 dose. They express a desire to resume Wegovy due to its effectiveness but express concern about potential side effects if they restart at the 1.7 dose after the break.  The patient also reports a significant improvement in their blood pressure since starting [facility], a benefit they greatly appreciate given their history of high blood pressure post-chemotherapy. They also note a decrease in joint discomfort and fewer severe headaches.  In terms of lifestyle, the patient maintains a vegetarian diet, emphasizing lean proteins, especially while on Wegovy. They also engage in resistance training three to four days a week to counteract potential muscle mass loss from the medication. Despite the challenges with the medication refill, the  patient describes the overall experience with [facility] as "life-changing. ".    PERTINENT PMH / PSH: As above  OBJECTIVE:  BP 114/68   Pulse 88   Temp 98 F (36.7 C)   Resp 16   Ht 5\' 3"  (1.6 m)   Wt 184 lb 2 oz (83.5 kg)   LMP 08/31/2023 (Approximate)   SpO2 96%   BMI 32.62 kg/m    Physical Exam Vitals reviewed.  Constitutional:      General: She is not in acute distress.    Appearance: Normal appearance. She is obese. She is not ill-appearing, toxic-appearing or diaphoretic.  Eyes:     General:        Right eye: No discharge.        Left eye: No discharge.     Conjunctiva/sclera: Conjunctivae normal.  Cardiovascular:     Rate and Rhythm: Normal rate.  Pulmonary:     Effort: Pulmonary effort is normal.  Musculoskeletal:        General: Normal range of motion.  Skin:    General: Skin is warm and dry.  Neurological:     General: No focal deficit present.     Mental Status: She is alert and oriented to person, place, and time. Mental status is at baseline.  Psychiatric:        Mood and Affect: Mood normal.        Behavior: Behavior normal.        Thought Content: Thought content normal.        Judgment: Judgment normal.        09/07/2023    1:51 PM 04/27/2023   10:58  AM 04/07/2023    9:44 AM 01/26/2023    3:58 PM 01/12/2023    9:44 AM  Depression screen PHQ 2/9  Decreased Interest 1 0 1 0 1  Down, Depressed, Hopeless 0 1 1 0 1  PHQ - 2 Score 1 1 2  0 2  Altered sleeping 0 1 1  2   Tired, decreased energy 0 1 1  2   Change in appetite 0 0 1  2  Feeling bad or failure about yourself  0 0 0  2  Trouble concentrating 0 0 0  2  Moving slowly or fidgety/restless 0 0 0  0  Suicidal thoughts 0 0 0  0  PHQ-9 Score 1 3 5  12   Difficult doing work/chores Not difficult at all  Somewhat difficult  Somewhat difficult      09/07/2023    1:51 PM 04/27/2023   10:59 AM 04/07/2023    9:44 AM 01/26/2023    3:58 PM  GAD 7 : Generalized Anxiety Score  Nervous, Anxious, on Edge 1 1  1  0  Control/stop worrying 1 1 1  0  Worry too much - different things 0 1 1 0  Trouble relaxing 1 1 1  0  Restless 1 0 0 0  Easily annoyed or irritable 0 1 1 0  Afraid - awful might happen 0 0 0 0  Total GAD 7 Score 4 5 5  0  Anxiety Difficulty Not difficult at all Somewhat difficult Somewhat difficult Not difficult at all    ASSESSMENT/PLAN:  Obesity (BMI 35.0-39.9 without comorbidity) Assessment & Plan: Successful weight loss with ZOXWRU 1.7mg  weekly, with a total of 22 pounds lost over 4 months. Patient experienced a disruption in medication due to insurance issues and has been off [place] since October 20th. Discussed the potential for side effects if restarting at 1.7mg  after a period of discontinuation. -PA was recently approved at [facility].  Patient to call for refill of Wegovy 1.7 mg weekly and continue prescriptions as prscribed -Advise patient to monitor for side effects and contact office if any occur. -Encourage continued healthy diet and exercise.   Need for influenza vaccination -     Flu vaccine trivalent PF, 6mos and older(Flulaval,Afluria,Fluarix,Fluzone)  Primary hypertension Assessment & Plan: Blood pressure well-controlled, with home readings around 120/70. Discussed potential need to adjust antihypertensive medication as weight loss continues. -Monitor blood pressure at home and report any symptoms of hypotension (dizziness, weakness). -Consider decreasing antihypertensive medication if symptoms of hypotension occur.    PDMP reviewed  Return in about 3 months (around 12/08/2023) for PCP.  Dana Allan, MD

## 2023-09-07 NOTE — Patient Instructions (Addendum)
It was a pleasure meeting you today. Thank you for allowing me to take part in your health care.  Our goals for today as we discussed include:  Restart Wegovy 1.7 mg weekly Call [facility] for refill  Flu vaccine today  This is a list of the screening recommended for you and due dates:  Health Maintenance  Topic Date Due   Flu Shot  05/28/2023   DTaP/Tdap/Td vaccine (2 - Td or Tdap) 06/22/2023   COVID-19 Vaccine (5 - 2023-24 season) 06/28/2023   Pap with HPV screening  11/28/2023   Hepatitis C Screening  Completed   HIV Screening  Completed   HPV Vaccine  Aged Out     Follow up in 3 months   If you have any questions or concerns, please do not hesitate to call the office at [phone].  I look forward to our next visit and until then take care and stay safe.  Regards,   Dana Allan, MD   [facility]

## 2023-09-07 NOTE — Telephone Encounter (Signed)
Pt has been notified.

## 2023-09-07 NOTE — Telephone Encounter (Signed)
Pharmacy Patient Advocate Encounter  Received notification from [facility] that Prior Authorization for [facility] has been APPROVED from 09/04/23 to 09/03/24   PA #/Case ID/Reference #: [id number]

## 2023-09-10 ENCOUNTER — Encounter: Payer: Self-pay | Admitting: Family Medicine

## 2023-09-10 DIAGNOSIS — Z23 Encounter for immunization: Secondary | ICD-10-CM | POA: Diagnosis not present

## 2023-09-10 DIAGNOSIS — I10 Essential (primary) hypertension: Secondary | ICD-10-CM | POA: Diagnosis not present

## 2023-09-10 NOTE — Assessment & Plan Note (Signed)
Blood pressure well-controlled, with home readings around 120/70. Discussed potential need to adjust antihypertensive medication as weight loss continues. -Monitor blood pressure at home and report any symptoms of hypotension (dizziness, weakness). -Consider decreasing antihypertensive medication if symptoms of hypotension occur.

## 2023-09-10 NOTE — Assessment & Plan Note (Signed)
Successful weight loss with ZOXWRU 1.7mg  weekly, with a total of 22 pounds lost over 4 months. Patient experienced a disruption in medication due to insurance issues and has been off [place] since October 20th. Discussed the potential for side effects if restarting at 1.7mg  after a period of discontinuation. -PA was recently approved at [facility].  Patient to call for refill of Wegovy 1.7 mg weekly and continue prescriptions as prscribed -Advise patient to monitor for side effects and contact office if any occur. -Encourage continued healthy diet and exercise.

## 2023-10-09 DIAGNOSIS — N135 Crossing vessel and stricture of ureter without hydronephrosis: Secondary | ICD-10-CM | POA: Diagnosis not present

## 2023-10-13 DIAGNOSIS — F900 Attention-deficit hyperactivity disorder, predominantly inattentive type: Secondary | ICD-10-CM | POA: Diagnosis not present

## 2023-10-13 DIAGNOSIS — F330 Major depressive disorder, recurrent, mild: Secondary | ICD-10-CM | POA: Diagnosis not present

## 2023-10-13 DIAGNOSIS — F411 Generalized anxiety disorder: Secondary | ICD-10-CM | POA: Diagnosis not present

## 2023-11-04 ENCOUNTER — Telehealth: Payer: Self-pay

## 2023-11-04 DIAGNOSIS — D2262 Melanocytic nevi of left upper limb, including shoulder: Secondary | ICD-10-CM | POA: Diagnosis not present

## 2023-11-04 DIAGNOSIS — D2272 Melanocytic nevi of left lower limb, including hip: Secondary | ICD-10-CM | POA: Diagnosis not present

## 2023-11-04 DIAGNOSIS — D2261 Melanocytic nevi of right upper limb, including shoulder: Secondary | ICD-10-CM | POA: Diagnosis not present

## 2023-11-04 DIAGNOSIS — D225 Melanocytic nevi of trunk: Secondary | ICD-10-CM | POA: Diagnosis not present

## 2023-11-04 NOTE — Telephone Encounter (Signed)
Pt needs PA for Wegovy.

## 2023-11-11 ENCOUNTER — Telehealth: Payer: Self-pay

## 2023-11-11 ENCOUNTER — Other Ambulatory Visit (HOSPITAL_COMMUNITY): Payer: Self-pay

## 2023-11-11 DIAGNOSIS — F411 Generalized anxiety disorder: Secondary | ICD-10-CM | POA: Diagnosis not present

## 2023-11-11 DIAGNOSIS — F332 Major depressive disorder, recurrent severe without psychotic features: Secondary | ICD-10-CM | POA: Diagnosis not present

## 2023-11-11 NOTE — Telephone Encounter (Signed)
 Pharmacy Patient Advocate Encounter   Received notification from Pt Calls Messages that prior authorization for Wegovy  1.7MG /0.75ML auto-injectors is required/requested.   Insurance verification completed.   The patient is insured through [facility] .   Per test claim: Refill too soon. PA is not needed at this time. Medication was filled 11/09/2023. Next eligible fill date is 11/30/2023.

## 2023-11-11 NOTE — Telephone Encounter (Signed)
 Per test claim: Refill too soon. PA is not needed at this time. Medication was filled 11/09/2023. Next eligible fill date is 11/30/2023.

## 2023-11-11 NOTE — Telephone Encounter (Signed)
 Noted.

## 2023-11-18 DIAGNOSIS — F411 Generalized anxiety disorder: Secondary | ICD-10-CM | POA: Diagnosis not present

## 2023-11-18 DIAGNOSIS — F332 Major depressive disorder, recurrent severe without psychotic features: Secondary | ICD-10-CM | POA: Diagnosis not present

## 2023-11-25 DIAGNOSIS — F411 Generalized anxiety disorder: Secondary | ICD-10-CM | POA: Diagnosis not present

## 2023-11-25 DIAGNOSIS — F332 Major depressive disorder, recurrent severe without psychotic features: Secondary | ICD-10-CM | POA: Diagnosis not present

## 2023-11-26 DIAGNOSIS — F900 Attention-deficit hyperactivity disorder, predominantly inattentive type: Secondary | ICD-10-CM | POA: Diagnosis not present

## 2023-11-26 DIAGNOSIS — F411 Generalized anxiety disorder: Secondary | ICD-10-CM | POA: Diagnosis not present

## 2023-11-26 DIAGNOSIS — F330 Major depressive disorder, recurrent, mild: Secondary | ICD-10-CM | POA: Diagnosis not present

## 2023-12-01 DIAGNOSIS — F411 Generalized anxiety disorder: Secondary | ICD-10-CM | POA: Diagnosis not present

## 2023-12-01 DIAGNOSIS — F330 Major depressive disorder, recurrent, mild: Secondary | ICD-10-CM | POA: Diagnosis not present

## 2023-12-01 DIAGNOSIS — F900 Attention-deficit hyperactivity disorder, predominantly inattentive type: Secondary | ICD-10-CM | POA: Diagnosis not present

## 2023-12-10 DIAGNOSIS — F411 Generalized anxiety disorder: Secondary | ICD-10-CM | POA: Diagnosis not present

## 2023-12-10 DIAGNOSIS — F900 Attention-deficit hyperactivity disorder, predominantly inattentive type: Secondary | ICD-10-CM | POA: Diagnosis not present

## 2023-12-10 DIAGNOSIS — F330 Major depressive disorder, recurrent, mild: Secondary | ICD-10-CM | POA: Diagnosis not present

## 2023-12-15 DIAGNOSIS — F411 Generalized anxiety disorder: Secondary | ICD-10-CM | POA: Diagnosis not present

## 2023-12-15 DIAGNOSIS — F332 Major depressive disorder, recurrent severe without psychotic features: Secondary | ICD-10-CM | POA: Diagnosis not present

## 2023-12-22 DIAGNOSIS — F332 Major depressive disorder, recurrent severe without psychotic features: Secondary | ICD-10-CM | POA: Diagnosis not present

## 2023-12-22 DIAGNOSIS — F411 Generalized anxiety disorder: Secondary | ICD-10-CM | POA: Diagnosis not present

## 2024-01-01 ENCOUNTER — Other Ambulatory Visit: Payer: Self-pay | Admitting: Family Medicine

## 2024-01-01 DIAGNOSIS — M26621 Arthralgia of right temporomandibular joint: Secondary | ICD-10-CM

## 2024-01-01 DIAGNOSIS — H6591 Unspecified nonsuppurative otitis media, right ear: Secondary | ICD-10-CM

## 2024-01-01 DIAGNOSIS — I10 Essential (primary) hypertension: Secondary | ICD-10-CM

## 2024-01-01 DIAGNOSIS — E039 Hypothyroidism, unspecified: Secondary | ICD-10-CM

## 2024-01-07 ENCOUNTER — Ambulatory Visit: Admitting: Family Medicine

## 2024-01-19 DIAGNOSIS — F411 Generalized anxiety disorder: Secondary | ICD-10-CM | POA: Diagnosis not present

## 2024-01-19 DIAGNOSIS — F332 Major depressive disorder, recurrent severe without psychotic features: Secondary | ICD-10-CM | POA: Diagnosis not present

## 2024-01-21 DIAGNOSIS — F411 Generalized anxiety disorder: Secondary | ICD-10-CM | POA: Diagnosis not present

## 2024-01-21 DIAGNOSIS — F330 Major depressive disorder, recurrent, mild: Secondary | ICD-10-CM | POA: Diagnosis not present

## 2024-01-21 DIAGNOSIS — F900 Attention-deficit hyperactivity disorder, predominantly inattentive type: Secondary | ICD-10-CM | POA: Diagnosis not present

## 2024-02-01 DIAGNOSIS — Z5181 Encounter for therapeutic drug level monitoring: Secondary | ICD-10-CM | POA: Diagnosis not present

## 2024-02-01 DIAGNOSIS — F1320 Sedative, hypnotic or anxiolytic dependence, uncomplicated: Secondary | ICD-10-CM | POA: Diagnosis not present

## 2024-02-09 ENCOUNTER — Encounter: Payer: Self-pay | Admitting: Obstetrics

## 2024-02-09 ENCOUNTER — Ambulatory Visit: Admitting: Obstetrics

## 2024-02-09 ENCOUNTER — Other Ambulatory Visit (HOSPITAL_COMMUNITY)
Admission: RE | Admit: 2024-02-09 | Discharge: 2024-02-09 | Disposition: A | Discharge status: Home / Self Care | Source: Ambulatory Visit | Attending: Obstetrics | Admitting: Obstetrics

## 2024-02-09 VITALS — BP 127/85 | HR 82 | Ht 64.0 in | Wt 175.0 lb

## 2024-02-09 DIAGNOSIS — Z01419 Encounter for gynecological examination (general) (routine) without abnormal findings: Secondary | ICD-10-CM | POA: Diagnosis not present

## 2024-02-09 DIAGNOSIS — Z124 Encounter for screening for malignant neoplasm of cervix: Secondary | ICD-10-CM | POA: Diagnosis not present

## 2024-02-09 DIAGNOSIS — N951 Menopausal and female climacteric states: Secondary | ICD-10-CM | POA: Insufficient documentation

## 2024-02-09 DIAGNOSIS — Z113 Encounter for screening for infections with a predominantly sexual mode of transmission: Secondary | ICD-10-CM | POA: Insufficient documentation

## 2024-02-09 DIAGNOSIS — Z7689 Persons encountering health services in other specified circumstances: Secondary | ICD-10-CM

## 2024-02-09 DIAGNOSIS — Z1231 Encounter for screening mammogram for malignant neoplasm of breast: Secondary | ICD-10-CM

## 2024-02-09 MED ORDER — LEVONORGEST-ETH ESTRAD 91-DAY 0.15-0.03 &0.01 MG PO TABS: 1.0000 | ORAL_TABLET | Freq: Every day | ORAL | 2 refills | Status: DC
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
## 2024-02-10 LAB — HEP, RPR, HIV PANEL
HIV Screen 4th Generation wRfx: NONREACTIVE
Hepatitis B Surface Ag: NEGATIVE
RPR Ser Ql: NONREACTIVE

## 2024-02-10 LAB — TSH+FREE T4
Free T4: 1.48 ng/dL (ref 0.82–1.77)
TSH: 0.315 u[IU]/mL — ABNORMAL LOW (ref 0.450–4.500)

## 2024-02-11 DIAGNOSIS — F900 Attention-deficit hyperactivity disorder, predominantly inattentive type: Secondary | ICD-10-CM | POA: Diagnosis not present

## 2024-02-11 DIAGNOSIS — F330 Major depressive disorder, recurrent, mild: Secondary | ICD-10-CM | POA: Diagnosis not present

## 2024-02-11 DIAGNOSIS — F411 Generalized anxiety disorder: Secondary | ICD-10-CM | POA: Diagnosis not present

## 2024-02-12 LAB — CYTOLOGY - PAP
Chlamydia: NEGATIVE
Comment: NEGATIVE
Comment: NEGATIVE
Comment: NEGATIVE
Comment: NEGATIVE
Comment: NEGATIVE
Comment: NORMAL
HPV 16: POSITIVE — AB
HPV 18 / 45: NEGATIVE
High risk HPV: POSITIVE — AB
Neisseria Gonorrhea: NEGATIVE
Trichomonas: NEGATIVE

## 2024-02-14 ENCOUNTER — Encounter: Payer: Self-pay | Admitting: Obstetrics

## 2024-02-14 ENCOUNTER — Other Ambulatory Visit: Payer: Self-pay | Admitting: Obstetrics

## 2024-02-14 MED ORDER — LEVOTHYROXINE SODIUM 88 MCG PO TABS: 88.0000 ug | ORAL_TABLET | Freq: Every day | ORAL | 0 refills | Status: DC

## 2024-02-18 DIAGNOSIS — F1320 Sedative, hypnotic or anxiolytic dependence, uncomplicated: Secondary | ICD-10-CM | POA: Diagnosis not present

## 2024-02-18 DIAGNOSIS — Z5181 Encounter for therapeutic drug level monitoring: Secondary | ICD-10-CM | POA: Diagnosis not present

## 2024-02-18 DIAGNOSIS — F332 Major depressive disorder, recurrent severe without psychotic features: Secondary | ICD-10-CM | POA: Diagnosis not present

## 2024-02-19 DIAGNOSIS — F411 Generalized anxiety disorder: Secondary | ICD-10-CM | POA: Diagnosis not present

## 2024-02-19 DIAGNOSIS — F332 Major depressive disorder, recurrent severe without psychotic features: Secondary | ICD-10-CM | POA: Diagnosis not present

## 2024-02-23 DIAGNOSIS — F1320 Sedative, hypnotic or anxiolytic dependence, uncomplicated: Secondary | ICD-10-CM | POA: Diagnosis not present

## 2024-02-23 DIAGNOSIS — Z5181 Encounter for therapeutic drug level monitoring: Secondary | ICD-10-CM | POA: Diagnosis not present

## 2024-02-23 DIAGNOSIS — F332 Major depressive disorder, recurrent severe without psychotic features: Secondary | ICD-10-CM | POA: Diagnosis not present

## 2024-02-25 DIAGNOSIS — Z5181 Encounter for therapeutic drug level monitoring: Secondary | ICD-10-CM | POA: Diagnosis not present

## 2024-02-25 DIAGNOSIS — F332 Major depressive disorder, recurrent severe without psychotic features: Secondary | ICD-10-CM | POA: Diagnosis not present

## 2024-02-25 DIAGNOSIS — F1320 Sedative, hypnotic or anxiolytic dependence, uncomplicated: Secondary | ICD-10-CM | POA: Diagnosis not present

## 2024-03-01 DIAGNOSIS — F333 Major depressive disorder, recurrent, severe with psychotic symptoms: Secondary | ICD-10-CM | POA: Diagnosis not present

## 2024-03-01 DIAGNOSIS — F1320 Sedative, hypnotic or anxiolytic dependence, uncomplicated: Secondary | ICD-10-CM | POA: Diagnosis not present

## 2024-03-01 DIAGNOSIS — Z5181 Encounter for therapeutic drug level monitoring: Secondary | ICD-10-CM | POA: Diagnosis not present

## 2024-03-03 DIAGNOSIS — F1320 Sedative, hypnotic or anxiolytic dependence, uncomplicated: Secondary | ICD-10-CM | POA: Diagnosis not present

## 2024-03-03 DIAGNOSIS — F332 Major depressive disorder, recurrent severe without psychotic features: Secondary | ICD-10-CM | POA: Diagnosis not present

## 2024-03-03 DIAGNOSIS — Z5181 Encounter for therapeutic drug level monitoring: Secondary | ICD-10-CM | POA: Diagnosis not present

## 2024-03-10 DIAGNOSIS — Z5181 Encounter for therapeutic drug level monitoring: Secondary | ICD-10-CM | POA: Diagnosis not present

## 2024-03-10 DIAGNOSIS — F332 Major depressive disorder, recurrent severe without psychotic features: Secondary | ICD-10-CM | POA: Diagnosis not present

## 2024-03-10 DIAGNOSIS — F1320 Sedative, hypnotic or anxiolytic dependence, uncomplicated: Secondary | ICD-10-CM | POA: Diagnosis not present

## 2024-03-15 DIAGNOSIS — F332 Major depressive disorder, recurrent severe without psychotic features: Secondary | ICD-10-CM | POA: Diagnosis not present

## 2024-03-15 DIAGNOSIS — F411 Generalized anxiety disorder: Secondary | ICD-10-CM | POA: Diagnosis not present

## 2024-03-22 ENCOUNTER — Ambulatory Visit

## 2024-03-22 ENCOUNTER — Ambulatory Visit (INDEPENDENT_AMBULATORY_CARE_PROVIDER_SITE_OTHER): Admitting: Obstetrics & Gynecology

## 2024-03-22 ENCOUNTER — Encounter: Payer: Self-pay | Admitting: Obstetrics & Gynecology

## 2024-03-22 ENCOUNTER — Other Ambulatory Visit (HOSPITAL_COMMUNITY)
Admission: RE | Admit: 2024-03-22 | Discharge: 2024-03-22 | Disposition: A | Discharge status: Home / Self Care | Source: Ambulatory Visit | Attending: Obstetrics & Gynecology | Admitting: Obstetrics & Gynecology

## 2024-03-22 VITALS — BP 140/92 | HR 97 | Resp 16 | Ht 63.0 in | Wt 177.3 lb

## 2024-03-22 DIAGNOSIS — F1320 Sedative, hypnotic or anxiolytic dependence, uncomplicated: Secondary | ICD-10-CM | POA: Diagnosis not present

## 2024-03-22 DIAGNOSIS — N888 Other specified noninflammatory disorders of cervix uteri: Secondary | ICD-10-CM

## 2024-03-22 DIAGNOSIS — Z5181 Encounter for therapeutic drug level monitoring: Secondary | ICD-10-CM | POA: Diagnosis not present

## 2024-03-22 DIAGNOSIS — R87810 Cervical high risk human papillomavirus (HPV) DNA test positive: Secondary | ICD-10-CM | POA: Diagnosis not present

## 2024-03-22 DIAGNOSIS — R87612 Low grade squamous intraepithelial lesion on cytologic smear of cervix (LGSIL): Secondary | ICD-10-CM | POA: Diagnosis not present

## 2024-03-22 DIAGNOSIS — F332 Major depressive disorder, recurrent severe without psychotic features: Secondary | ICD-10-CM | POA: Diagnosis not present

## 2024-03-22 DIAGNOSIS — Z79899 Other long term (current) drug therapy: Secondary | ICD-10-CM | POA: Diagnosis not present

## 2024-03-22 DIAGNOSIS — Z23 Encounter for immunization: Secondary | ICD-10-CM

## 2024-03-22 DIAGNOSIS — N858 Other specified noninflammatory disorders of uterus: Secondary | ICD-10-CM | POA: Diagnosis not present

## 2024-03-22 DIAGNOSIS — N72 Inflammatory disease of cervix uteri: Secondary | ICD-10-CM | POA: Diagnosis not present

## 2024-03-22 NOTE — Addendum Note (Signed)
 Addended by: Lendon Queen on: 03/22/2024 03:51 PM   Modules accepted: Orders

## 2024-03-22 NOTE — Patient Instructions (Addendum)
HPV (Human Papillomavirus) Vaccine: What You Need to Know Many vaccine information statements are available in Spanish and other languages. See PromoAge.com.br. 1. Why get vaccinated? HPV (human papillomavirus) vaccine can prevent infection with some types of human papillomavirus. HPV infections can cause certain types of cancers, including: cervical, vaginal, and vulvar cancers in women penile cancer in men anal cancers in both men and women cancers of tonsils, base of tongue, and back of throat (oropharyngeal cancer) in both men and women HPV infections can also cause anogenital warts. HPV vaccine can prevent over 90% of cancers caused by HPV. HPV is spread through intimate skin-to-skin or sexual contact. HPV infections are so common that nearly all people will get at least one type of HPV at some time in their lives. Most HPV infections go away on their own within 2 years. But sometimes HPV infections will last longer and can cause cancers later in life. 2. HPV vaccine HPV vaccine is routinely recommended for adolescents at 75 or 44 years of age to ensure they are protected before they are exposed to the virus. HPV vaccine may be given beginning at age 38 years and vaccination is recommended for everyone through 44 years of age. HPV vaccine may be given to adults 27 through 44 years of age, based on discussions between the patient and health care provider. Most children who get the first dose before 78 years of age need 2 doses of HPV vaccine. People who get the first dose at or after 36 years of age and younger people with certain immunocompromising conditions need 3 doses. Your health care provider can give you more information. HPV vaccine may be given at the same time as other vaccines. 3. Talk with your health care provider Tell your vaccination provider if the person getting the vaccine: Has had an allergic reaction after a previous dose of HPV vaccine, or has any severe,  life-threatening allergies Is pregnant--HPV vaccine is not recommended until after pregnancy In some cases, your health care provider may decide to postpone HPV vaccination until a future visit. People with minor illnesses, such as a cold, may be vaccinated. People who are moderately or severely ill should usually wait until they recover before getting HPV vaccine. Your health care provider can give you more information. 4. Risks of a vaccine reaction Soreness, redness, or swelling where the shot is given can happen after HPV vaccination. Fever or headache can happen after HPV vaccination. People sometimes faint after medical procedures, including vaccination. Tell your provider if you feel dizzy or have vision changes or ringing in the ears. As with any medicine, there is a very remote chance of a vaccine causing a severe allergic reaction, other serious injury, or death. 5. What if there is a serious problem? An allergic reaction could occur after the vaccinated person leaves the clinic. If you see signs of a severe allergic reaction (hives, swelling of the face and throat, difficulty breathing, a fast heartbeat, dizziness, or weakness), call 9-1-1 and get the person to the nearest hospital. For other signs that concern you, call your health care provider. Adverse reactions should be reported to the Vaccine Adverse Event Reporting System (VAERS). Your health care provider will usually file this report, or you can do it yourself. Visit the VAERS website at www.vaers.LAgents.no or call 862-204-0009. VAERS is only for reporting reactions, and VAERS staff members do not give medical advice. 6. The National Vaccine Injury Compensation Program The [employer] Vaccine Injury Compensation Program (VICP) is a  federal program that was created to compensate people who may have been injured by certain vaccines. Claims regarding alleged injury or death due to vaccination have a time limit for filing, which may be as  short as two years. Visit the VICP website at SpiritualWord.at or call 3198108303 to learn about the program and about filing a claim. 7. How can I learn more? Ask your health care provider. Call your local or state health department. Visit the website of the Food and Drug Administration (FDA) for vaccine package inserts and additional information at FinderList.no. Contact the Centers for Disease Control and Prevention (CDC): Call 330 606 3391 (1-800-CDC-INFO) or Visit CDC's website at PicCapture.uy. Source: CDC Vaccine Information Statement HPV Vaccine (06/01/2020) This same material is available at FootballExhibition.com.br for no charge. This information is not intended to replace advice given to you by your health care provider. Make sure you discuss any questions you have with your health care provider. Document Revised: 01/28/2023 Document Reviewed: 11/03/2022 Elsevier Patient Education  2024 Elsevier Inc. Colposcopy  Colposcopy is a procedure to examine the lowest part of the uterus (cervix) for abnormalities or signs of disease. This procedure is done using an instrument that makes objects appear larger and provides light (colposcope). During the procedure, your health care provider may remove a tissue sample to look at under a microscope (biopsy). A biopsy may be done if any unusual cells are seen during the colposcopy. You may have a colposcopy if you have: An abnormal Pap smear, also called a Pap test. This screening test is used to check for signs of cancer or infection of the vagina, cervix, and uterus. An HPV (human papillomavirus) test and get a positive result for a type of HPV that puts you at high risk of cancer. Certain conditions or symptoms, such as: A sore, or lesion, on your cervix. Genital warts on your vulva, vagina, or cervix. Pain during sex. Vaginal bleeding, especially after sex. A growth on your cervix (cervical  polyp) that needs to be removed. Let your health care provider know about: Any allergies you have, including allergies to medicines, latex, or iodine. All medicines you are taking, including vitamins, herbs, eye drops, creams, and over-the-counter medicines. Any bleeding problems you have. Any surgeries you have had. Any medical conditions you have, such as pelvic inflammatory disease (PID) or an endometrial disorder. The pattern of your menstrual cycles and the form of birth control (contraception) you use, if any. Your medical history, including any cervical treatments and how well you tolerated the procedure (if you have ever fainted). Whether you are pregnant or may be pregnant. What are the risks? Generally, this is a safe procedure. However, problems may occur, including: Infection. Symptoms of infection may include fever, bad-smelling vaginal discharge, or pelvic pain. Vaginal bleeding. Allergic reactions to medicines. Damage to nearby structures or organs. What happens before the procedure? Medicines Ask your health care provider about: Changing or stopping your regular medicines. This is especially important if you are taking diabetes medicines or blood thinners. Taking medicines such as aspirin and ibuprofen. These medicines can thin your blood. Do not take these medicines unless your health care provider tells you to take them. Your health care provider will likely tell you to avoid taking aspirin, or medicine that contains aspirin, for 7 days before the procedure. Taking over-the-counter medicines, vitamins, herbs, and supplements. General instructions Tell your health care provider if you have your menstrual period now or will have it at the time of your procedure. A  colposcopy is not normally done during your menstrual period. If you use contraception, continue to use it before your procedure. For 24 hours before the procedure: Do not use douche products or tampons. Do not  use medicines, creams, or suppositories in the vagina. Do not have sex or insert anything into your vagina. Ask your health care provider what steps will be taken to prevent infection. What happens during the procedure? You will lie down on your back, with your feet in foot rests (stirrups). An instrument called a speculum will be inserted into your vagina. This will be used so your health care provider can see your cervix and the inside of your vagina. A cotton swab will be used to place a small amount of a liquid (solution) on the areas to be examined. This solution makes it easier to see abnormal cells. You may feel a slight burning during this part. The colposcope will be used to scan the cervix with a bright white light. The colposcope will be held near your vulva and will make your vulva, vagina, and cervix look bigger so they can be seen better. If a biopsy is needed: You may be given a medicine to numb the area (local anesthetic). Surgical tools will be used to remove mucus and cells through your vagina. You may feel mild pain while the tissue sample is removed. Bleeding may occur. A solution may be used to stop the bleeding. The tissue removed will be sent to a lab to be looked at under a microscope. The procedure may vary among health care providers and hospitals. What happens after the procedure? You may have some cramping in your abdomen. This should go away after a few minutes. It is up to you to get the results of your procedure. Ask your health care provider, or the department that is doing the procedure, when your results will be ready. Summary Colposcopy is a procedure to examine the lowest part of the uterus (cervix), for signs of disease. A biopsy may be done as part of the procedure. You may have some cramping in your abdomen. This should go away after a few minutes. It is up to you to get the results of your procedure. Ask your health care provider, or the department that  is doing the procedure, when your results will be ready. This information is not intended to replace advice given to you by your health care provider. Make sure you discuss any questions you have with your health care provider. Document Revised: 03/10/2021 Document Reviewed: 03/10/2021 Elsevier Patient Education  2024 [employer].

## 2024-03-22 NOTE — Progress Notes (Signed)
    GYNECOLOGY OFFICE COLPOSCOPY PROCEDURE NOTE  44 y.o. 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 Discussed role for HPV in cervical dysplasia, need for surveillance.  She has a h/o lymphoma at age 86, places her at increased risk for other cancers.  Patient gave informed written consent, time out was performed.  Placed in lithotomy position. Cervix viewed with speculum and colposcope after application of acetic acid.   Colposcopy adequate? Yes  Acetowhite changes noted in a circumferential pattern on the ectocervix extending about 1 cm out from the os in all directions. Patulous cervix noted.  ECC specimen obtained. All specimens were labeled and sent to pathology.  Chaperone was present during entire procedure.  Patient was given post procedure instructions.  Will follow up pathology and manage accordingly; patient will be contacted with results and recommendations.  Routine preventative health maintenance measures emphasized.  She will start Gardasil series today.  Stephane Ee, MD [facility]

## 2024-03-23 DIAGNOSIS — F332 Major depressive disorder, recurrent severe without psychotic features: Secondary | ICD-10-CM | POA: Diagnosis not present

## 2024-03-23 DIAGNOSIS — F900 Attention-deficit hyperactivity disorder, predominantly inattentive type: Secondary | ICD-10-CM | POA: Diagnosis not present

## 2024-03-23 DIAGNOSIS — F411 Generalized anxiety disorder: Secondary | ICD-10-CM | POA: Diagnosis not present

## 2024-03-24 DIAGNOSIS — Z79899 Other long term (current) drug therapy: Secondary | ICD-10-CM | POA: Diagnosis not present

## 2024-03-24 DIAGNOSIS — Z5181 Encounter for therapeutic drug level monitoring: Secondary | ICD-10-CM | POA: Diagnosis not present

## 2024-03-24 DIAGNOSIS — F1320 Sedative, hypnotic or anxiolytic dependence, uncomplicated: Secondary | ICD-10-CM | POA: Diagnosis not present

## 2024-03-24 DIAGNOSIS — F411 Generalized anxiety disorder: Secondary | ICD-10-CM | POA: Diagnosis not present

## 2024-03-24 DIAGNOSIS — F900 Attention-deficit hyperactivity disorder, predominantly inattentive type: Secondary | ICD-10-CM | POA: Diagnosis not present

## 2024-03-24 DIAGNOSIS — F332 Major depressive disorder, recurrent severe without psychotic features: Secondary | ICD-10-CM | POA: Diagnosis not present

## 2024-03-24 LAB — SURGICAL PATHOLOGY

## 2024-03-25 ENCOUNTER — Encounter: Payer: Self-pay | Admitting: Obstetrics & Gynecology

## 2024-03-28 DIAGNOSIS — F411 Generalized anxiety disorder: Secondary | ICD-10-CM | POA: Diagnosis not present

## 2024-03-28 DIAGNOSIS — F332 Major depressive disorder, recurrent severe without psychotic features: Secondary | ICD-10-CM | POA: Diagnosis not present

## 2024-03-29 ENCOUNTER — Other Ambulatory Visit: Payer: Self-pay | Admitting: Family

## 2024-03-29 DIAGNOSIS — E669 Obesity, unspecified: Secondary | ICD-10-CM

## 2024-03-31 DIAGNOSIS — Z5181 Encounter for therapeutic drug level monitoring: Secondary | ICD-10-CM | POA: Diagnosis not present

## 2024-03-31 DIAGNOSIS — F332 Major depressive disorder, recurrent severe without psychotic features: Secondary | ICD-10-CM | POA: Diagnosis not present

## 2024-03-31 DIAGNOSIS — Z79899 Other long term (current) drug therapy: Secondary | ICD-10-CM | POA: Diagnosis not present

## 2024-03-31 DIAGNOSIS — F1320 Sedative, hypnotic or anxiolytic dependence, uncomplicated: Secondary | ICD-10-CM | POA: Diagnosis not present

## 2024-03-31 DIAGNOSIS — F411 Generalized anxiety disorder: Secondary | ICD-10-CM | POA: Diagnosis not present

## 2024-04-07 DIAGNOSIS — F332 Major depressive disorder, recurrent severe without psychotic features: Secondary | ICD-10-CM | POA: Diagnosis not present

## 2024-04-07 DIAGNOSIS — F900 Attention-deficit hyperactivity disorder, predominantly inattentive type: Secondary | ICD-10-CM | POA: Diagnosis not present

## 2024-04-07 DIAGNOSIS — Z79899 Other long term (current) drug therapy: Secondary | ICD-10-CM | POA: Diagnosis not present

## 2024-04-07 DIAGNOSIS — F1320 Sedative, hypnotic or anxiolytic dependence, uncomplicated: Secondary | ICD-10-CM | POA: Diagnosis not present

## 2024-04-07 DIAGNOSIS — Z5181 Encounter for therapeutic drug level monitoring: Secondary | ICD-10-CM | POA: Diagnosis not present

## 2024-04-07 DIAGNOSIS — F411 Generalized anxiety disorder: Secondary | ICD-10-CM | POA: Diagnosis not present

## 2024-04-14 DIAGNOSIS — F332 Major depressive disorder, recurrent severe without psychotic features: Secondary | ICD-10-CM | POA: Diagnosis not present

## 2024-04-18 DIAGNOSIS — F411 Generalized anxiety disorder: Secondary | ICD-10-CM | POA: Diagnosis not present

## 2024-04-18 DIAGNOSIS — F332 Major depressive disorder, recurrent severe without psychotic features: Secondary | ICD-10-CM | POA: Diagnosis not present

## 2024-04-25 DIAGNOSIS — F411 Generalized anxiety disorder: Secondary | ICD-10-CM | POA: Diagnosis not present

## 2024-04-25 DIAGNOSIS — F1320 Sedative, hypnotic or anxiolytic dependence, uncomplicated: Secondary | ICD-10-CM | POA: Diagnosis not present

## 2024-04-25 DIAGNOSIS — F332 Major depressive disorder, recurrent severe without psychotic features: Secondary | ICD-10-CM | POA: Diagnosis not present

## 2024-04-25 DIAGNOSIS — Z79899 Other long term (current) drug therapy: Secondary | ICD-10-CM | POA: Diagnosis not present

## 2024-04-25 DIAGNOSIS — Z5181 Encounter for therapeutic drug level monitoring: Secondary | ICD-10-CM | POA: Diagnosis not present

## 2024-04-25 DIAGNOSIS — F900 Attention-deficit hyperactivity disorder, predominantly inattentive type: Secondary | ICD-10-CM | POA: Diagnosis not present

## 2024-04-26 ENCOUNTER — Ambulatory Visit
Admission: EM | Admit: 2024-04-26 | Discharge: 2024-04-26 | Disposition: A | Discharge status: Home / Self Care | Source: Ambulatory Visit | Attending: Emergency Medicine | Admitting: Emergency Medicine

## 2024-04-26 ENCOUNTER — Encounter: Payer: Self-pay | Admitting: Emergency Medicine

## 2024-04-26 DIAGNOSIS — F330 Major depressive disorder, recurrent, mild: Secondary | ICD-10-CM | POA: Diagnosis not present

## 2024-04-26 DIAGNOSIS — Z113 Encounter for screening for infections with a predominantly sexual mode of transmission: Secondary | ICD-10-CM | POA: Diagnosis not present

## 2024-04-26 DIAGNOSIS — F411 Generalized anxiety disorder: Secondary | ICD-10-CM | POA: Diagnosis not present

## 2024-04-26 DIAGNOSIS — F900 Attention-deficit hyperactivity disorder, predominantly inattentive type: Secondary | ICD-10-CM | POA: Diagnosis not present

## 2024-04-26 LAB — POCT URINALYSIS DIP (MANUAL ENTRY)
Bilirubin, UA: NEGATIVE
Glucose, UA: NEGATIVE mg/dL
Ketones, POC UA: NEGATIVE mg/dL
Leukocytes, UA: NEGATIVE
Nitrite, UA: NEGATIVE
Protein Ur, POC: NEGATIVE mg/dL
Spec Grav, UA: 1.02 (ref 1.010–1.025)
Urobilinogen, UA: 0.2 U/dL
pH, UA: 6.5 (ref 5.0–8.0)

## 2024-04-26 NOTE — Discharge Instructions (Addendum)
 Urinalysis is negative  Labs pending 2-3 days, you will be contacted if positive for any sti and treatment will be sent to the pharmacy, you will have to return to the clinic if positive for gonorrhea to receive treatment   Please refrain from having sex until labs results, if positive please refrain from having sex until treatment complete and symptoms resolve   If positive for HIV, Syphilis, Chlamydia  gonorrhea or trichomoniasis please notify partner or partners so they may tested as well  Moving forward, it is recommended you use some form of protection against the transmission of sti infections  such as condoms or dental dams with each sexual encounter

## 2024-04-26 NOTE — ED Provider Notes (Signed)
 Samantha Meyers    CSN: [id number] Arrival date & time: 04/26/24  1550      History   Chief Complaint Chief Complaint  Patient presents with   Exposure to STD    HPI Samantha Meyers is a 43 y.o. female.   Patient presents for evaluation of routine STI testing after condom breaking during sexual intercourse, occurred 3 weeks ago.  Denies all symptoms, denies exposure.  Past Medical History:  Diagnosis Date   ADHD    Ankle pain 01/21/2018   Annual physical exam 10/12/2019   Anxiety    Asthma    rare inhaler use   Cancer (HCC)    Carpal tunnel syndrome of right wrist 02/15/2020   COVID-19 02/2021   mild all symptoms resolved   Depression    History of kidney stones    Hodgkin's lymphoma (HCC)    dx'ed 2005 tx'ed with chemo/radiation   Hypertension    Left ankle tendonitis 10/02/2021   PONV (postoperative nausea and vomiting)    Right wrist tendonitis 10/02/2021   Sprain of right ankle 12/31/2021   Tendonitis 10/02/2021   Thyroid  disease    UTI (urinary tract infection)    recurrent   Wears contact lenses    Wrist pain 09/28/2018    Patient Active Problem List   Diagnosis Date Noted   Perimenopause 02/09/2024   Need for influenza vaccination 09/10/2023   Right otitis media with effusion 05/23/2023   Arthralgia of right temporomandibular joint 05/23/2023   Obesity (BMI 35.0-39.9 without comorbidity) 04/07/2023   Contraception management 01/26/2023   Urinary frequency 01/12/2023   Lipid screening 01/12/2023   Breast cancer screening by mammogram 01/12/2023   Need for immunization against influenza 01/12/2023   At increased risk for cardiovascular disease 07/03/2022   Anxiety 12/26/2021   Toxicity of radiation, 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   Keratosis pilaris 02/12/2018   H/O Hodgkin's lymphoma 01/21/2018   Seasonal allergies 01/21/2018   ADHD (attention deficit hyperactivity disorder), 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   Personal history of radiation  exposure 06/08/2017   Mood disorder (HCC) 05/21/2017   Hypothyroidism 05/21/2017   Hypertensive disorder 05/06/2017    Past Surgical History:  Procedure Laterality Date   CYSTOSCOPY W/ URETERAL STENT PLACEMENT Right 06/27/2022   Procedure: CYSTOSCOPY WITH BILATERAL RETROGRADE PYELOGRAM/ URETEROSCOPY/URETERAL STENT PLACEMENT;  Surgeon: Devere Lonni Righter, MD;  Location: [facility];  Service: Urology;  Laterality: Right;   KIDNEY SURGERY     blockage surgery in 2001 right UPJ obstruction repair with stent   port a cath insertion  11/2003   PORT-A-CATH REMOVAL     2005    TONSILLECTOMY     1987    OB History     Gravida  3   Para  3   Term  3   Preterm      AB      Living  3      SAB      IAB      Ectopic      Multiple      Live Births  3            Home Medications    Prior to Admission medications   Medication Sig Start Date End Date Taking? Authorizing Provider  albuterol  (PROAIR  HFA) 108 (90 Base) MCG/ACT inhaler Inhale 1-2 puffs into the lungs every 4 (four) hours as needed for wheezing or shortness of breath. 03/22/21   McLean-Scocuzza, Randine SAILOR, MD  amphetamine -dextroamphetamine (ADDERALL) 5 MG  tablet SMARTSIG:1 Tablet(s) By Mouth Every Evening 11/28/22   [provider]  cholecalciferol (VITAMIN D3) 25 MCG (1000 UNIT) tablet Take 1,000 Units by mouth daily.    [provider]  fluticasone  (FLONASE ) 50 MCG/ACT nasal spray SPRAY 2 SPRAYS INTO EACH NOSTRIL EVERY DAY 05/19/23   Hope Merle, MD  Levonorgestrel-Ethinyl Estradiol (AMETHIA) 0.15-0.03 &0.01 MG tablet Take 1 tablet by mouth at bedtime. 02/09/24   Justino Eleanor HERO, CNM  levothyroxine  (SYNTHROID ) 88 MCG tablet Take 1 tablet (88 mcg total) by mouth daily before breakfast. 02/14/24   Justino Eleanor HERO, CNM  loratadine  (CLARITIN ) 10 MG tablet TAKE 1 TABLET BY MOUTH EVERY DAY AS NEEDED FOR ALLERGY    [provider]  losartan  (COZAAR ) 25 MG tablet TAKE 1  TABLET (25 MG TOTAL) BY MOUTH DAILY. 01/06/24   Hope Merle, MD  omeprazole  (PRILOSEC) 20 MG capsule TAKE 1 CAPSULE BY MOUTH EVERY DAY 01/06/24   Hope Merle, MD  saccharomyces boulardii (FLORASTOR) 250 MG capsule Take 1 capsule (250 mg total) by mouth daily. 01/12/23   Walsh, Tanya, MD  traZODone (DESYREL) 50 MG tablet Take 50 mg by mouth at bedtime as needed. 02/27/21   [provider]  venlafaxine XR (EFFEXOR-XR) 150 MG 24 hr capsule Take 150 mg by mouth daily. 02/19/21   [provider]  VYVANSE 10 MG capsule Take 10 mg by mouth daily. 08/16/21   [provider]  WEGOVY  1.7 MG/0.75ML SOAJ INJECT 1.7MG  INTO THE SKIN ONCE WEEKLY 03/30/24   Dineen Rollene MATSU, FNP    Family History Family History  Problem Relation Age of Onset   Depression Mother        postpartum   Hyperlipidemia Mother    Cancer Father        lung in remission small cell cancer former smoker    Depression Father    Diabetes Father    Hearing loss Father    Hypertension Father    Depression Sister    ADD / ADHD Sister    Kidney disease Brother    ADD / ADHD Brother    Diabetes Son        DM 1    Cancer Maternal Grandmother        breast    Early death Paternal Grandmother    Stroke Paternal Grandmother    Eating disorder Sister     Social History Social History   Tobacco Use   Smoking status: Never   Smokeless tobacco: Never  Vaping Use   Vaping status: Never Used  Substance Use Topics   Alcohol use: Yes    Comment: occas.   Drug use: No     Allergies   Patient has no known allergies.   Review of Systems Review of Systems   Physical Exam Triage Vital Signs ED Triage Vitals  Encounter Vitals Group     BP 04/26/24 1607 117/87     Girls Systolic BP Percentile --      Girls Diastolic BP Percentile --      Boys Systolic BP Percentile --      Boys Diastolic BP Percentile --      Pulse Rate 04/26/24 1607 66     Resp 04/26/24 1607 18     Temp 04/26/24 1607 98.9 F  (37.2 C)     Temp Source 04/26/24 1607 Oral     SpO2 04/26/24 1607 99 %     Weight --      Height --  Head Circumference --      Peak Flow --      Pain Score 04/26/24 1604 0     Pain Loc --      Pain Education --      Exclude from Growth Chart --    No data found.  Updated Vital Signs BP 117/87 (BP Location: Left Arm)   Pulse 66   Temp 98.9 F (37.2 C) (Oral)   Resp 18   LMP 04/05/2024 (Approximate)   SpO2 99%   Visual Acuity Right Eye Distance:   Left Eye Distance:   Bilateral Distance:    Right Eye Near:   Left Eye Near:    Bilateral Near:     Physical Exam Constitutional:      Appearance: Normal appearance.   Eyes:     Extraocular Movements: Extraocular movements intact.   Pulmonary:     Effort: Pulmonary effort is normal.  Genitourinary:    Comments: deferred  Neurological:     Mental Status: She is alert and oriented to person, place, and time. Mental status is at baseline.      UC Treatments / Results  Labs (all labs ordered are listed, but only abnormal results are displayed) Labs Reviewed  RPR  HIV ANTIBODY (ROUTINE TESTING W REFLEX)  HSV 1 ANTIBODY, IGG  HSV 2 ANTIBODY, IGG  POCT URINALYSIS DIP (MANUAL ENTRY)  CERVICOVAGINAL ANCILLARY ONLY    EKG   Radiology No results found.  Procedures Procedures (including critical care time)  Medications Ordered in UC Medications - No data to display  Initial Impression / Assessment and Plan / UC Course  I have reviewed the triage vital signs and the nursing notes.  Pertinent labs & imaging results that were available during my care of the patient were reviewed by me and considered in my medical decision making (see chart for details).  Routine screening for STI   STI labs pending will treat per protocol, advised abstinence until lab results, and/or treatment is complete, advised condom use during all sexual encounters moving, may follow-up with urgent care as needed  Final Clinical  Impressions(s) / UC Diagnoses   Final diagnoses:  Routine screening for STI (sexually transmitted infection)     Discharge Instructions      Urinalysis is negative  Labs pending 2-3 days, you will be contacted if positive for any sti and treatment will be sent to the pharmacy, you will have to return to the clinic if positive for gonorrhea to receive treatment   Please refrain from having sex until labs results, if positive please refrain from having sex until treatment complete and symptoms resolve   If positive for HIV, Syphilis, Chlamydia  gonorrhea or trichomoniasis please notify partner or partners so they may tested as well  Moving forward, it is recommended you use some form of protection against the transmission of sti infections  such as condoms or dental dams with each sexual encounter     ED Prescriptions   None    PDMP not reviewed this encounter.   Teresa Shelba SAUNDERS, [place] 04/26/24 1645

## 2024-04-26 NOTE — ED Triage Notes (Signed)
 Patient requesting full STD check due to broken condom. Denies symptoms.

## 2024-04-28 LAB — CERVICOVAGINAL ANCILLARY ONLY
Bacterial Vaginitis (gardnerella): NEGATIVE
Candida Glabrata: NEGATIVE
Candida Vaginitis: NEGATIVE
Chlamydia: NEGATIVE
Comment: NEGATIVE
Comment: NEGATIVE
Comment: NEGATIVE
Comment: NEGATIVE
Comment: NEGATIVE
Comment: NORMAL
Neisseria Gonorrhea: NEGATIVE
Trichomonas: NEGATIVE

## 2024-04-28 LAB — HIV ANTIBODY (ROUTINE TESTING W REFLEX): HIV Screen 4th Generation wRfx: NONREACTIVE

## 2024-04-28 LAB — RPR: RPR Ser Ql: NONREACTIVE

## 2024-04-28 LAB — HSV 1 ANTIBODY, IGG: HSV 1 Glycoprotein G Ab, IgG: NONREACTIVE

## 2024-04-29 ENCOUNTER — Other Ambulatory Visit: Payer: Self-pay | Admitting: Family

## 2024-04-29 DIAGNOSIS — E669 Obesity, unspecified: Secondary | ICD-10-CM

## 2024-05-04 NOTE — Telephone Encounter (Signed)
 Spoke to pt she is in [place] with Dad who is on Hospice unable to schedule earlier [facility] appt but does need refill of Wegovy 

## 2024-05-10 DIAGNOSIS — F411 Generalized anxiety disorder: Secondary | ICD-10-CM | POA: Diagnosis not present

## 2024-05-10 DIAGNOSIS — F900 Attention-deficit hyperactivity disorder, predominantly inattentive type: Secondary | ICD-10-CM | POA: Diagnosis not present

## 2024-05-10 DIAGNOSIS — Z79899 Other long term (current) drug therapy: Secondary | ICD-10-CM | POA: Diagnosis not present

## 2024-05-10 DIAGNOSIS — F332 Major depressive disorder, recurrent severe without psychotic features: Secondary | ICD-10-CM | POA: Diagnosis not present

## 2024-05-10 DIAGNOSIS — Z5181 Encounter for therapeutic drug level monitoring: Secondary | ICD-10-CM | POA: Diagnosis not present

## 2024-05-10 DIAGNOSIS — F1520 Other stimulant dependence, uncomplicated: Secondary | ICD-10-CM | POA: Diagnosis not present

## 2024-05-12 ENCOUNTER — Other Ambulatory Visit: Payer: Self-pay | Admitting: Obstetrics

## 2024-05-23 ENCOUNTER — Ambulatory Visit

## 2024-05-23 DIAGNOSIS — F411 Generalized anxiety disorder: Secondary | ICD-10-CM | POA: Diagnosis not present

## 2024-05-23 DIAGNOSIS — Z79899 Other long term (current) drug therapy: Secondary | ICD-10-CM | POA: Diagnosis not present

## 2024-05-23 DIAGNOSIS — F900 Attention-deficit hyperactivity disorder, predominantly inattentive type: Secondary | ICD-10-CM | POA: Diagnosis not present

## 2024-05-23 DIAGNOSIS — Z5181 Encounter for therapeutic drug level monitoring: Secondary | ICD-10-CM | POA: Diagnosis not present

## 2024-05-23 DIAGNOSIS — F332 Major depressive disorder, recurrent severe without psychotic features: Secondary | ICD-10-CM | POA: Diagnosis not present

## 2024-05-23 DIAGNOSIS — F1320 Sedative, hypnotic or anxiolytic dependence, uncomplicated: Secondary | ICD-10-CM | POA: Diagnosis not present

## 2024-05-24 NOTE — Progress Notes (Deleted)
    NURSE VISIT NOTE  Subjective:    Patient ID: Samantha Meyers, female    DOB: 1981-03-05, 43 y.o.   MRN: [id number]  HPI  Patient is a 43 y.o. G67P3003 female Married {Race/ethnicity:17218} female who presents for her second Gardasil injection. Order to administer given by Harland Birkenhead, MD on 03/22/24.   Objective:    LMP 04/05/2024 (Approximate)   43 y.o. LMP:  ***  Contraception:  {CCO Contraception:21020264} Given by: {AOB Clinical Dujqq:71459} Site:  {left/right:311354} deltoid  Lab Review  No results found for any visits on 05/25/24.    Assessment:   No diagnosis found.   Plan:   Patient will return in {Gardasil Return Visit:28539} for {FIRST SECOND THIRD:18671} injection.    Harlene Gander, CMA

## 2024-05-25 ENCOUNTER — Ambulatory Visit

## 2024-06-01 DIAGNOSIS — F900 Attention-deficit hyperactivity disorder, predominantly inattentive type: Secondary | ICD-10-CM | POA: Diagnosis not present

## 2024-06-01 DIAGNOSIS — F332 Major depressive disorder, recurrent severe without psychotic features: Secondary | ICD-10-CM | POA: Diagnosis not present

## 2024-06-01 DIAGNOSIS — F1320 Sedative, hypnotic or anxiolytic dependence, uncomplicated: Secondary | ICD-10-CM | POA: Diagnosis not present

## 2024-06-01 DIAGNOSIS — F411 Generalized anxiety disorder: Secondary | ICD-10-CM | POA: Diagnosis not present

## 2024-06-01 DIAGNOSIS — Z5181 Encounter for therapeutic drug level monitoring: Secondary | ICD-10-CM | POA: Diagnosis not present

## 2024-06-01 DIAGNOSIS — Z79899 Other long term (current) drug therapy: Secondary | ICD-10-CM | POA: Diagnosis not present

## 2024-06-03 ENCOUNTER — Encounter: Admitting: Nurse Practitioner

## 2024-06-06 DIAGNOSIS — Z79899 Other long term (current) drug therapy: Secondary | ICD-10-CM | POA: Diagnosis not present

## 2024-06-06 DIAGNOSIS — Z5181 Encounter for therapeutic drug level monitoring: Secondary | ICD-10-CM | POA: Diagnosis not present

## 2024-06-06 DIAGNOSIS — F900 Attention-deficit hyperactivity disorder, predominantly inattentive type: Secondary | ICD-10-CM | POA: Diagnosis not present

## 2024-06-06 DIAGNOSIS — F332 Major depressive disorder, recurrent severe without psychotic features: Secondary | ICD-10-CM | POA: Diagnosis not present

## 2024-06-06 DIAGNOSIS — F1320 Sedative, hypnotic or anxiolytic dependence, uncomplicated: Secondary | ICD-10-CM | POA: Diagnosis not present

## 2024-06-06 DIAGNOSIS — F411 Generalized anxiety disorder: Secondary | ICD-10-CM | POA: Diagnosis not present

## 2024-06-15 ENCOUNTER — Ambulatory Visit: Admitting: Internal Medicine

## 2024-06-15 ENCOUNTER — Encounter: Payer: Self-pay | Admitting: Internal Medicine

## 2024-06-15 VITALS — BP 122/76 | HR 68 | Temp 98.2°F | Ht 63.0 in | Wt 170.4 lb

## 2024-06-15 DIAGNOSIS — F332 Major depressive disorder, recurrent severe without psychotic features: Secondary | ICD-10-CM | POA: Diagnosis not present

## 2024-06-15 DIAGNOSIS — J302 Other seasonal allergic rhinitis: Secondary | ICD-10-CM

## 2024-06-15 DIAGNOSIS — K219 Gastro-esophageal reflux disease without esophagitis: Secondary | ICD-10-CM | POA: Diagnosis not present

## 2024-06-15 DIAGNOSIS — E039 Hypothyroidism, unspecified: Secondary | ICD-10-CM | POA: Diagnosis not present

## 2024-06-15 DIAGNOSIS — F411 Generalized anxiety disorder: Secondary | ICD-10-CM | POA: Diagnosis not present

## 2024-06-15 DIAGNOSIS — I10 Essential (primary) hypertension: Secondary | ICD-10-CM

## 2024-06-15 DIAGNOSIS — Z79899 Other long term (current) drug therapy: Secondary | ICD-10-CM | POA: Diagnosis not present

## 2024-06-15 DIAGNOSIS — F900 Attention-deficit hyperactivity disorder, predominantly inattentive type: Secondary | ICD-10-CM | POA: Diagnosis not present

## 2024-06-15 DIAGNOSIS — E669 Obesity, unspecified: Secondary | ICD-10-CM

## 2024-06-15 DIAGNOSIS — F331 Major depressive disorder, recurrent, moderate: Secondary | ICD-10-CM | POA: Diagnosis not present

## 2024-06-15 DIAGNOSIS — F1320 Sedative, hypnotic or anxiolytic dependence, uncomplicated: Secondary | ICD-10-CM | POA: Diagnosis not present

## 2024-06-15 DIAGNOSIS — Z5181 Encounter for therapeutic drug level monitoring: Secondary | ICD-10-CM | POA: Diagnosis not present

## 2024-06-15 MED ORDER — LORATADINE 10 MG PO TABS: 10.0000 mg | ORAL_TABLET | Freq: Every day | ORAL | 3 refills | Status: AC

## 2024-06-15 MED ORDER — WEGOVY 1.7 MG/0.75ML ~~LOC~~ SOAJ: 1.7000 mg | SUBCUTANEOUS | 2 refills | Status: DC

## 2024-06-15 MED ORDER — LOSARTAN POTASSIUM 25 MG PO TABS: 25.0000 mg | ORAL_TABLET | Freq: Every day | ORAL | 3 refills | Status: DC

## 2024-06-15 MED ORDER — OMEPRAZOLE 20 MG PO CPDR: 20.0000 mg | DELAYED_RELEASE_CAPSULE | Freq: Every day | ORAL | 1 refills | Status: AC

## 2024-06-15 NOTE — Patient Instructions (Addendum)
-   It was a pleasure meeting you today -We will check your TSH and free T4 level today prior to refilling your Synthroid . -Your blood pressure is well-controlled on the current regimen.  We will continue with losartan  25 mg daily.  I have put in the refill of this medication for you -If your weight continues to improve and is down approximately 7 pounds since her last visit and 30 pounds overall.  Please continue with your current diet and excise regimen.  I have refilled your Wegovy  for you today -Reflux is well-controlled as well.  I have put in a refill for your omeprazole  -Please contact with any questions or concerns or if you need any refills

## 2024-06-15 NOTE — Assessment & Plan Note (Signed)
-   This problem is chronic and stable -Patient is on omeprazole  20 mg daily for this - Her symptoms are well-controlled on this dose of medication -Will refill this medication for her today

## 2024-06-15 NOTE — Assessment & Plan Note (Signed)
-   This problem is chronic and stable -Patient on Claritin  as needed for this -Will refill this medication for today -No further workup at this time

## 2024-06-15 NOTE — Assessment & Plan Note (Addendum)
-   This problem is chronic and improving -Patient is currently on Wegovy  1.7 mg weekly with a loss of 30 pounds since last year -She has lost 7 pounds since her last visit in May -Patient states that she walks about 1 mile daily and exercises with weights approximately 3 times a week -She attempts to follow a healthy diet -Will continue with current dose of Wegovy .  I have refilled this medication for her today -No further workup at this time

## 2024-06-15 NOTE — Progress Notes (Signed)
 Acute Office Visit  Subjective:     Patient ID: Samantha Meyers, female    DOB: [birth year], 43 y.o.   MRN: [id number]  Chief Complaint  Patient presents with   Medication Refill    Medication Refill   Patient is in today for follow-up of her hypothyroidism.  Patient had a visit in March with her PCP and was noted to have a low TSH and had her Synthroid  dose decreased to 88 mcg from 100 mcg.  She needs a refill of this medication and also lab work to ensure that the 88 mcg dose is appropriate for her.  Patient's blood pressure is at goal today (122/76).  She is compliant with her losartan  25 mg daily  Patient does have a history of reflux and is on omeprazole  daily for this.  She states that on the medication she has no symptoms of reflux.  She does have a history of obesity and is on Wegovy  for this.  She walks her dog 1 mile every day and also exercises with weights 3 times a week.  She attempts to eat a healthy diet as well.  Her weight is down 30 pounds from last year and 7 pounds from her previous visit in May.  Review of Systems  Constitutional: Negative.   HENT: Negative.    Respiratory: Negative.    Cardiovascular: Negative.   Gastrointestinal: Negative.   Genitourinary: Negative.   Musculoskeletal: Negative.   Neurological: Negative.   Psychiatric/Behavioral: Negative.          Objective:    BP 122/76   Pulse 68   Temp 98.2 F (36.8 C)   Ht 5' 3 (1.6 m)   Wt 170 lb 6.4 oz (77.3 kg)   SpO2 98%   BMI 30.19 kg/m    Physical Exam Constitutional:      Appearance: Normal appearance.  HENT:     Head: Normocephalic and atraumatic.     Right Ear: Tympanic membrane normal. There is no impacted cerumen.     Left Ear: Tympanic membrane normal. There is no impacted cerumen.     Mouth/Throat:     Pharynx: Oropharynx is clear. No oropharyngeal exudate or posterior oropharyngeal erythema.  Cardiovascular:     Rate and Rhythm: Normal rate and regular rhythm.      Heart sounds: Normal heart sounds.  Pulmonary:     Breath sounds: Normal breath sounds. No wheezing or rales.  Abdominal:     General: Bowel sounds are normal. There is no distension.     Palpations: Abdomen is soft.     Tenderness: There is no abdominal tenderness.  Musculoskeletal:        General: No swelling or tenderness.  Neurological:     Mental Status: She is alert.  Psychiatric:        Mood and Affect: Mood normal.        Behavior: Behavior normal.     No results found for any visits on 06/15/24.      Assessment & Plan:   Problem List Items Addressed This Visit       Cardiovascular and Mediastinum   Hypertensive disorder - Primary   - This problem is chronic and stable -Patient's blood pressures are well-controlled (122/76 today) -Will continue with losartan  25 mg daily -She had a BMP done in March this year which was within normal limits -Will refill her losartan  for her today -No further workup at this time      Relevant Medications  losartan  (COZAAR ) 25 MG tablet     Digestive   GERD (gastroesophageal reflux disease)   - This problem is chronic and stable -Patient is on omeprazole  20 mg daily for this - Her symptoms are well-controlled on this dose of medication -Will refill this medication for her today      Relevant Medications   omeprazole  (PRILOSEC) 20 MG capsule     Endocrine   Hypothyroidism   - This problem is chronic and stable -Patient's last TSH was low and her Synthroid  dose was adjusted down from 100 mcg to 88 mcg daily -Will check her TSH and free T4 today -Will refill her Synthroid  dose based on her lab work -No further workup at this time      Relevant Orders   TSH   T4, free     Other   Obesity (BMI 30-39.9)   - This problem is chronic and improving -Patient is currently on Wegovy  1.7 mg weekly with a loss of 30 pounds since last year -She has lost 7 pounds since her last visit in May -Patient states that she walks  about 1 mile daily and exercises with weights approximately 3 times a week -She attempts to follow a healthy diet -Will continue with current dose of Wegovy .  I have refilled this medication for her today -No further workup at this time      Relevant Medications   semaglutide -weight management (WEGOVY ) 1.7 MG/0.75ML SOAJ SQ injection   Seasonal allergies   - This problem is chronic and stable -Patient on Claritin  as needed for this -Will refill this medication for today -No further workup at this time      Relevant Medications   loratadine  (CLARITIN ) 10 MG tablet    Meds ordered this encounter  Medications   omeprazole  (PRILOSEC) 20 MG capsule    Sig: Take 1 capsule (20 mg total) by mouth daily.    Dispense:  90 capsule    Refill:  1   losartan  (COZAAR ) 25 MG tablet    Sig: Take 1 tablet (25 mg total) by mouth daily.    Dispense:  90 tablet    Refill:  3   loratadine  (CLARITIN ) 10 MG tablet    Sig: Take 1 tablet (10 mg total) by mouth daily.    Dispense:  90 tablet    Refill:  3   semaglutide -weight management (WEGOVY ) 1.7 MG/0.75ML SOAJ SQ injection    Sig: Inject 1.7 mg into the skin once a week.    Dispense:  4 mL    Refill:  2    No follow-ups on file.  Iliany Losier, MD

## 2024-06-15 NOTE — Assessment & Plan Note (Signed)
-   This problem is chronic and stable -Patient's last TSH was low and her Synthroid  dose was adjusted down from 100 mcg to 88 mcg daily -Will check her TSH and free T4 today -Will refill her Synthroid  dose based on her lab work -No further workup at this time

## 2024-06-15 NOTE — Assessment & Plan Note (Addendum)
-   This problem is chronic and stable -Patient's blood pressures are well-controlled (122/76 today) -Will continue with losartan  25 mg daily -She had a BMP done in March this year which was within normal limits -Will refill her losartan  for her today -No further workup at this time

## 2024-06-16 ENCOUNTER — Ambulatory Visit: Payer: Self-pay | Admitting: Internal Medicine

## 2024-06-16 LAB — TSH: TSH: 0.77 u[IU]/mL (ref 0.35–5.50)

## 2024-06-16 LAB — T4, FREE: Free T4: 0.84 ng/dL (ref 0.60–1.60)

## 2024-06-22 DIAGNOSIS — F332 Major depressive disorder, recurrent severe without psychotic features: Secondary | ICD-10-CM | POA: Diagnosis not present

## 2024-06-22 DIAGNOSIS — Z5181 Encounter for therapeutic drug level monitoring: Secondary | ICD-10-CM | POA: Diagnosis not present

## 2024-06-22 DIAGNOSIS — F1320 Sedative, hypnotic or anxiolytic dependence, uncomplicated: Secondary | ICD-10-CM | POA: Diagnosis not present

## 2024-06-22 DIAGNOSIS — Z79899 Other long term (current) drug therapy: Secondary | ICD-10-CM | POA: Diagnosis not present

## 2024-06-22 DIAGNOSIS — F900 Attention-deficit hyperactivity disorder, predominantly inattentive type: Secondary | ICD-10-CM | POA: Diagnosis not present

## 2024-06-22 DIAGNOSIS — F411 Generalized anxiety disorder: Secondary | ICD-10-CM | POA: Diagnosis not present

## 2024-06-29 DIAGNOSIS — F1320 Sedative, hypnotic or anxiolytic dependence, uncomplicated: Secondary | ICD-10-CM | POA: Diagnosis not present

## 2024-06-29 DIAGNOSIS — Z79899 Other long term (current) drug therapy: Secondary | ICD-10-CM | POA: Diagnosis not present

## 2024-06-29 DIAGNOSIS — Z5181 Encounter for therapeutic drug level monitoring: Secondary | ICD-10-CM | POA: Diagnosis not present

## 2024-06-29 DIAGNOSIS — F900 Attention-deficit hyperactivity disorder, predominantly inattentive type: Secondary | ICD-10-CM | POA: Diagnosis not present

## 2024-06-29 DIAGNOSIS — F332 Major depressive disorder, recurrent severe without psychotic features: Secondary | ICD-10-CM | POA: Diagnosis not present

## 2024-06-29 DIAGNOSIS — F411 Generalized anxiety disorder: Secondary | ICD-10-CM | POA: Diagnosis not present

## 2024-07-04 DIAGNOSIS — F411 Generalized anxiety disorder: Secondary | ICD-10-CM | POA: Diagnosis not present

## 2024-07-04 DIAGNOSIS — F332 Major depressive disorder, recurrent severe without psychotic features: Secondary | ICD-10-CM | POA: Diagnosis not present

## 2024-07-06 DIAGNOSIS — F411 Generalized anxiety disorder: Secondary | ICD-10-CM | POA: Diagnosis not present

## 2024-07-06 DIAGNOSIS — F332 Major depressive disorder, recurrent severe without psychotic features: Secondary | ICD-10-CM | POA: Diagnosis not present

## 2024-07-06 DIAGNOSIS — Z79899 Other long term (current) drug therapy: Secondary | ICD-10-CM | POA: Diagnosis not present

## 2024-07-06 DIAGNOSIS — F1520 Other stimulant dependence, uncomplicated: Secondary | ICD-10-CM | POA: Diagnosis not present

## 2024-07-06 DIAGNOSIS — F900 Attention-deficit hyperactivity disorder, predominantly inattentive type: Secondary | ICD-10-CM | POA: Diagnosis not present

## 2024-07-06 DIAGNOSIS — Z5181 Encounter for therapeutic drug level monitoring: Secondary | ICD-10-CM | POA: Diagnosis not present

## 2024-07-15 DIAGNOSIS — F1320 Sedative, hypnotic or anxiolytic dependence, uncomplicated: Secondary | ICD-10-CM | POA: Diagnosis not present

## 2024-07-15 DIAGNOSIS — F332 Major depressive disorder, recurrent severe without psychotic features: Secondary | ICD-10-CM | POA: Diagnosis not present

## 2024-07-15 DIAGNOSIS — Z5181 Encounter for therapeutic drug level monitoring: Secondary | ICD-10-CM | POA: Diagnosis not present

## 2024-07-20 DIAGNOSIS — F1320 Sedative, hypnotic or anxiolytic dependence, uncomplicated: Secondary | ICD-10-CM | POA: Diagnosis not present

## 2024-07-20 DIAGNOSIS — F332 Major depressive disorder, recurrent severe without psychotic features: Secondary | ICD-10-CM | POA: Diagnosis not present

## 2024-07-20 DIAGNOSIS — F411 Generalized anxiety disorder: Secondary | ICD-10-CM | POA: Diagnosis not present

## 2024-07-20 DIAGNOSIS — Z5181 Encounter for therapeutic drug level monitoring: Secondary | ICD-10-CM | POA: Diagnosis not present

## 2024-07-20 DIAGNOSIS — F900 Attention-deficit hyperactivity disorder, predominantly inattentive type: Secondary | ICD-10-CM | POA: Diagnosis not present

## 2024-07-20 DIAGNOSIS — Z79899 Other long term (current) drug therapy: Secondary | ICD-10-CM | POA: Diagnosis not present

## 2024-07-25 DIAGNOSIS — F332 Major depressive disorder, recurrent severe without psychotic features: Secondary | ICD-10-CM | POA: Diagnosis not present

## 2024-07-25 DIAGNOSIS — F411 Generalized anxiety disorder: Secondary | ICD-10-CM | POA: Diagnosis not present

## 2024-07-27 DIAGNOSIS — Z5181 Encounter for therapeutic drug level monitoring: Secondary | ICD-10-CM | POA: Diagnosis not present

## 2024-07-27 DIAGNOSIS — Z79899 Other long term (current) drug therapy: Secondary | ICD-10-CM | POA: Diagnosis not present

## 2024-07-27 DIAGNOSIS — F900 Attention-deficit hyperactivity disorder, predominantly inattentive type: Secondary | ICD-10-CM | POA: Diagnosis not present

## 2024-07-27 DIAGNOSIS — F1320 Sedative, hypnotic or anxiolytic dependence, uncomplicated: Secondary | ICD-10-CM | POA: Diagnosis not present

## 2024-07-27 DIAGNOSIS — F332 Major depressive disorder, recurrent severe without psychotic features: Secondary | ICD-10-CM | POA: Diagnosis not present

## 2024-08-01 ENCOUNTER — Encounter: Payer: Self-pay | Admitting: Internal Medicine

## 2024-08-01 DIAGNOSIS — F332 Major depressive disorder, recurrent severe without psychotic features: Secondary | ICD-10-CM | POA: Diagnosis not present

## 2024-08-01 DIAGNOSIS — F411 Generalized anxiety disorder: Secondary | ICD-10-CM | POA: Diagnosis not present

## 2024-08-01 DIAGNOSIS — E039 Hypothyroidism, unspecified: Secondary | ICD-10-CM

## 2024-08-01 MED ORDER — LEVOTHYROXINE SODIUM 88 MCG PO TABS: 88.0000 ug | ORAL_TABLET | Freq: Every day | ORAL | 1 refills | Status: AC

## 2024-08-01 NOTE — Telephone Encounter (Signed)
 Previously filled by gyn doctor. Pt requesting refill, has toc scheduled for lester on 08/14/24

## 2024-08-03 DIAGNOSIS — F332 Major depressive disorder, recurrent severe without psychotic features: Secondary | ICD-10-CM | POA: Diagnosis not present

## 2024-08-03 DIAGNOSIS — F1320 Sedative, hypnotic or anxiolytic dependence, uncomplicated: Secondary | ICD-10-CM | POA: Diagnosis not present

## 2024-08-03 DIAGNOSIS — Z5181 Encounter for therapeutic drug level monitoring: Secondary | ICD-10-CM | POA: Diagnosis not present

## 2024-08-03 DIAGNOSIS — F900 Attention-deficit hyperactivity disorder, predominantly inattentive type: Secondary | ICD-10-CM | POA: Diagnosis not present

## 2024-08-09 ENCOUNTER — Ambulatory Visit: Admitting: Nurse Practitioner

## 2024-08-09 ENCOUNTER — Encounter: Payer: Self-pay | Admitting: Nurse Practitioner

## 2024-08-09 VITALS — BP 136/96 | HR 79 | Temp 98.0°F | Ht 63.0 in | Wt 164.8 lb

## 2024-08-09 DIAGNOSIS — E039 Hypothyroidism, unspecified: Secondary | ICD-10-CM | POA: Diagnosis not present

## 2024-08-09 DIAGNOSIS — I10 Essential (primary) hypertension: Secondary | ICD-10-CM | POA: Diagnosis not present

## 2024-08-09 DIAGNOSIS — F39 Unspecified mood [affective] disorder: Secondary | ICD-10-CM | POA: Diagnosis not present

## 2024-08-09 DIAGNOSIS — E669 Obesity, unspecified: Secondary | ICD-10-CM

## 2024-08-09 MED ORDER — LOSARTAN POTASSIUM 50 MG PO TABS: 50.0000 mg | ORAL_TABLET | Freq: Every day | ORAL | 1 refills | Status: DC

## 2024-08-09 NOTE — Progress Notes (Signed)
 Leron Glance, NP-C Phone: [phone]  Samantha Meyers is a 43 y.o. female who presents today for transfer of care.   Discussed the use of AI scribe software for clinical note transcription with the patient, who gave verbal consent to proceed.  History of Present Illness   Samantha Meyers is a 43 year old female with hypertension who presents for transfer of care.  She has been experiencing erratic blood pressure readings, often elevated during periods of stress. She uses a wrist cuff for home monitoring and notes that her blood pressure is sometimes normal but frequently high when she feels overwhelmed. She has been on losartan  25 mg since she was 43 years old, with no changes in dosage. No chest pain, shortness of breath, dizziness, swelling, headaches, or vision changes.  She has a history of Hodgkin's lymphoma and is concerned about the long-term effects of past treatments on her heart. She is on venlafaxine, trazodone, and receives ketamine treatments for psychiatric reasons. 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 She has a history of thyroid  issues due to radiation treatment and is currently on levothyroxine . Her recent thyroid  function levels have been stable.  She is on Wegovy  for weight loss, as recommended by her oncologist. She has lost about 25 pounds and is on a maintenance dose of 1.7 mg, with no nausea and good tolerance of the medication.  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 She has not been able to exercise much recently, which she believes would help with weight loss.      Social History   Tobacco Use  Smoking Status Never  Smokeless Tobacco Never    Current Outpatient Medications on File Prior to Visit  Medication Sig Dispense Refill   albuterol  (PROAIR  HFA) 108 (90 Base) MCG/ACT inhaler Inhale 1-2 puffs into the lungs every 4 (four) hours as needed for wheezing or shortness of breath. 18  g 11   amphetamine -dextroamphetamine (ADDERALL) 5 MG tablet SMARTSIG:1 Tablet(s) By Mouth Every Evening     cholecalciferol (VITAMIN D3) 25 MCG (1000 UNIT) tablet Take 1,000 Units by mouth daily.     fluticasone  (FLONASE ) 50 MCG/ACT nasal spray SPRAY 2 SPRAYS INTO EACH NOSTRIL EVERY DAY 48 mL 1   levothyroxine  (SYNTHROID ) 88 MCG tablet Take 1 tablet (88 mcg total) by mouth daily before breakfast. 90 tablet 1   loratadine  (CLARITIN ) 10 MG tablet Take 1 tablet (10 mg total) by mouth daily. 90 tablet 3   omeprazole  (PRILOSEC) 20 MG capsule Take 1 capsule (20 mg total) by mouth daily. 90 capsule 1   saccharomyces boulardii (FLORASTOR) 250 MG capsule Take 1 capsule (250 mg total) by mouth daily. 90 capsule 0   semaglutide -weight management (WEGOVY ) 1.7 MG/0.75ML SOAJ SQ injection Inject 1.7 mg into the skin once a week. 4 mL 2   traZODone (DESYREL) 50 MG tablet Take 50 mg by mouth at bedtime as needed.     venlafaxine XR (EFFEXOR-XR) 150 MG 24 hr capsule Take 150 mg by mouth daily.     VYVANSE 10 MG capsule Take 10 mg by mouth daily.     No current facility-administered medications on file prior to visit.    ROS see history of present illness  Objective  Physical Exam Vitals:   08/09/24 0933 08/09/24 0946  BP: (!) 144/98 (!) 136/96  Pulse: 79   Temp: 98 F (36.7 C)   SpO2: 99%     BP Readings from  Last 3 Encounters:  08/09/24 (!) 136/96  06/15/24 122/76  04/26/24 117/87   Wt Readings from Last 3 Encounters:  08/09/24 164 lb 12.8 oz (74.8 kg)  06/15/24 170 lb 6.4 oz (77.3 kg)  03/22/24 177 lb 4.8 oz (80.4 kg)    Physical Exam Constitutional:      General: She is not in acute distress.    Appearance: Normal appearance.  HENT:     Head: Normocephalic.  Cardiovascular:     Rate and Rhythm: Normal rate and regular rhythm.     Heart sounds: Normal heart sounds.  Pulmonary:     Effort: Pulmonary effort is normal.     Breath sounds: Normal breath sounds.  Skin:    General:  Skin is warm and dry.  Neurological:     General: No focal deficit present.     Mental Status: She is alert.  Psychiatric:        Mood and Affect: Mood normal.        Behavior: Behavior normal.      Assessment/Plan: Please see individual problem list.  Primary hypertension Assessment & Plan: Blood pressure is labile and influenced by stress, with home readings high-normal. She is currently on losartan  25 mg and remains asymptomatic. BP elevated x 2 readings today. Increase losartan  to 50 mg daily. Provide a blood pressure log for home monitoring. Schedule a follow-up in two weeks for blood pressure check and BMP.   Orders: -     Losartan  Potassium; Take 1 tablet (50 mg total) by mouth daily.  Dispense: 90 tablet; Refill: 1 -     Basic metabolic panel with GFR; Future  Hypothyroidism, unspecified type Assessment & Plan: Condition is well-managed on levothyroxine  88 mcg with stable thyroid  levels, though she reports feeling cold. Continue levothyroxine  88 mcg daily.     Mood disorder Assessment & Plan: Stable on current medication regimen. Managed by Psychiatry. Encourage follow up as scheduled with Dr. Chipper and therapist.    Obesity (BMI 30-39.9) Assessment & Plan: She continue to lose weight on Wegovy  1.7 mg weekly. She is not exercising but tolerates the medication well. Continue Wegovy  1.7 mg as maintenance. Encourage continued healthy diet and increased exercise to enhance weight loss.       Return in about 2 weeks (around 08/23/2024) for Blood pressure check with nursing and lab, then 6 month follow up.   Leron Glance, NP-C [facility]

## 2024-08-09 NOTE — Assessment & Plan Note (Signed)
 Blood pressure is labile and influenced by stress, with home readings high-normal. She is currently on losartan  25 mg and remains asymptomatic. BP elevated x 2 readings today. Increase losartan  to 50 mg daily. Provide a blood pressure log for home monitoring. Schedule a follow-up in two weeks for blood pressure check and BMP.

## 2024-08-09 NOTE — Assessment & Plan Note (Signed)
 Stable on current medication regimen. Managed by Psychiatry. Encourage follow up as scheduled with Dr. Chipper and therapist.

## 2024-08-09 NOTE — Assessment & Plan Note (Signed)
 Condition is well-managed on levothyroxine  88 mcg with stable thyroid  levels, though she reports feeling cold. Continue levothyroxine  88 mcg daily.

## 2024-08-09 NOTE — Assessment & Plan Note (Signed)
 She continue to lose weight on Wegovy  1.7 mg weekly. She is not exercising but tolerates the medication well. Continue Wegovy  1.7 mg as maintenance. Encourage continued healthy diet and increased exercise to enhance weight loss.

## 2024-08-11 ENCOUNTER — Other Ambulatory Visit (HOSPITAL_COMMUNITY): Payer: Self-pay

## 2024-08-17 DIAGNOSIS — F332 Major depressive disorder, recurrent severe without psychotic features: Secondary | ICD-10-CM | POA: Diagnosis not present

## 2024-08-17 DIAGNOSIS — F900 Attention-deficit hyperactivity disorder, predominantly inattentive type: Secondary | ICD-10-CM | POA: Diagnosis not present

## 2024-08-17 DIAGNOSIS — Z5181 Encounter for therapeutic drug level monitoring: Secondary | ICD-10-CM | POA: Diagnosis not present

## 2024-08-17 DIAGNOSIS — F1320 Sedative, hypnotic or anxiolytic dependence, uncomplicated: Secondary | ICD-10-CM | POA: Diagnosis not present

## 2024-08-17 DIAGNOSIS — Z79899 Other long term (current) drug therapy: Secondary | ICD-10-CM | POA: Diagnosis not present

## 2024-08-22 DIAGNOSIS — F332 Major depressive disorder, recurrent severe without psychotic features: Secondary | ICD-10-CM | POA: Diagnosis not present

## 2024-08-22 DIAGNOSIS — F411 Generalized anxiety disorder: Secondary | ICD-10-CM | POA: Diagnosis not present

## 2024-08-24 ENCOUNTER — Ambulatory Visit: Payer: Self-pay | Admitting: Nurse Practitioner

## 2024-08-24 ENCOUNTER — Ambulatory Visit

## 2024-08-24 DIAGNOSIS — F1320 Sedative, hypnotic or anxiolytic dependence, uncomplicated: Secondary | ICD-10-CM | POA: Diagnosis not present

## 2024-08-24 DIAGNOSIS — F900 Attention-deficit hyperactivity disorder, predominantly inattentive type: Secondary | ICD-10-CM | POA: Diagnosis not present

## 2024-08-24 DIAGNOSIS — F411 Generalized anxiety disorder: Secondary | ICD-10-CM | POA: Diagnosis not present

## 2024-08-24 DIAGNOSIS — I10 Essential (primary) hypertension: Secondary | ICD-10-CM | POA: Diagnosis not present

## 2024-08-24 DIAGNOSIS — Z5181 Encounter for therapeutic drug level monitoring: Secondary | ICD-10-CM | POA: Diagnosis not present

## 2024-08-24 DIAGNOSIS — F332 Major depressive disorder, recurrent severe without psychotic features: Secondary | ICD-10-CM | POA: Diagnosis not present

## 2024-08-24 DIAGNOSIS — Z79899 Other long term (current) drug therapy: Secondary | ICD-10-CM | POA: Diagnosis not present

## 2024-08-24 LAB — BASIC METABOLIC PANEL WITH GFR
BUN: 15 mg/dL (ref 6–23)
CO2: 27 meq/L (ref 19–32)
Calcium: 9.6 mg/dL (ref 8.4–10.5)
Chloride: 103 meq/L (ref 96–112)
Creatinine, Ser: 0.99 mg/dL (ref 0.40–1.20)
GFR: 69.32 mL/min (ref >60.00)
Glucose, Bld: 72 mg/dL (ref 70–99)
Potassium: 4.9 meq/L (ref 3.5–5.1)
Sodium: 137 meq/L (ref 135–145)

## 2024-08-24 NOTE — Progress Notes (Signed)
 Patient arrived for a BP check 172/88 so I let the Patient sit for 6-7 minutes then checked her BP again 160/88. Patient states she is taking 50 mg of Losartan  in the morning. Patient denies any headache, blurred vision or any other symptoms. Patient sat for about 20 minutes waiting on her provider that had me recheck the BP a 3rd time 154/84. Leron Glance the PCP is going to increase the Losartan  to 100 mg daily.

## 2024-08-31 DIAGNOSIS — Z79899 Other long term (current) drug therapy: Secondary | ICD-10-CM | POA: Diagnosis not present

## 2024-08-31 DIAGNOSIS — F1320 Sedative, hypnotic or anxiolytic dependence, uncomplicated: Secondary | ICD-10-CM | POA: Diagnosis not present

## 2024-08-31 DIAGNOSIS — F332 Major depressive disorder, recurrent severe without psychotic features: Secondary | ICD-10-CM | POA: Diagnosis not present

## 2024-08-31 DIAGNOSIS — F900 Attention-deficit hyperactivity disorder, predominantly inattentive type: Secondary | ICD-10-CM | POA: Diagnosis not present

## 2024-08-31 DIAGNOSIS — Z5181 Encounter for therapeutic drug level monitoring: Secondary | ICD-10-CM | POA: Diagnosis not present

## 2024-09-07 DIAGNOSIS — F332 Major depressive disorder, recurrent severe without psychotic features: Secondary | ICD-10-CM | POA: Diagnosis not present

## 2024-09-07 DIAGNOSIS — Z79899 Other long term (current) drug therapy: Secondary | ICD-10-CM | POA: Diagnosis not present

## 2024-09-07 DIAGNOSIS — F1320 Sedative, hypnotic or anxiolytic dependence, uncomplicated: Secondary | ICD-10-CM | POA: Diagnosis not present

## 2024-09-07 DIAGNOSIS — Z5181 Encounter for therapeutic drug level monitoring: Secondary | ICD-10-CM | POA: Diagnosis not present

## 2024-09-07 DIAGNOSIS — F900 Attention-deficit hyperactivity disorder, predominantly inattentive type: Secondary | ICD-10-CM | POA: Diagnosis not present

## 2024-09-08 DIAGNOSIS — F411 Generalized anxiety disorder: Secondary | ICD-10-CM | POA: Diagnosis not present

## 2024-09-08 DIAGNOSIS — F332 Major depressive disorder, recurrent severe without psychotic features: Secondary | ICD-10-CM | POA: Diagnosis not present

## 2024-09-14 DIAGNOSIS — F900 Attention-deficit hyperactivity disorder, predominantly inattentive type: Secondary | ICD-10-CM | POA: Diagnosis not present

## 2024-09-14 DIAGNOSIS — F332 Major depressive disorder, recurrent severe without psychotic features: Secondary | ICD-10-CM | POA: Diagnosis not present

## 2024-09-14 DIAGNOSIS — Z79899 Other long term (current) drug therapy: Secondary | ICD-10-CM | POA: Diagnosis not present

## 2024-09-14 DIAGNOSIS — Z5181 Encounter for therapeutic drug level monitoring: Secondary | ICD-10-CM | POA: Diagnosis not present

## 2024-09-14 DIAGNOSIS — F1320 Sedative, hypnotic or anxiolytic dependence, uncomplicated: Secondary | ICD-10-CM | POA: Diagnosis not present

## 2024-09-21 DIAGNOSIS — F411 Generalized anxiety disorder: Secondary | ICD-10-CM | POA: Diagnosis not present

## 2024-09-21 DIAGNOSIS — Z79899 Other long term (current) drug therapy: Secondary | ICD-10-CM | POA: Diagnosis not present

## 2024-09-21 DIAGNOSIS — F1320 Sedative, hypnotic or anxiolytic dependence, uncomplicated: Secondary | ICD-10-CM | POA: Diagnosis not present

## 2024-09-21 DIAGNOSIS — F900 Attention-deficit hyperactivity disorder, predominantly inattentive type: Secondary | ICD-10-CM | POA: Diagnosis not present

## 2024-09-21 DIAGNOSIS — Z5181 Encounter for therapeutic drug level monitoring: Secondary | ICD-10-CM | POA: Diagnosis not present

## 2024-09-21 DIAGNOSIS — F332 Major depressive disorder, recurrent severe without psychotic features: Secondary | ICD-10-CM | POA: Diagnosis not present

## 2024-09-26 ENCOUNTER — Ambulatory Visit

## 2024-09-26 VITALS — BP 115/78 | HR 86 | Ht 63.0 in | Wt 116.1 lb

## 2024-09-26 DIAGNOSIS — Z23 Encounter for immunization: Secondary | ICD-10-CM | POA: Diagnosis not present

## 2024-09-26 NOTE — Progress Notes (Signed)
    NURSE VISIT NOTE  Subjective:    Patient ID: Samantha Meyers, female    DOB: 05-21-1981, 43 y.o.   MRN: [id number]  HPI  Patient is a 43 y.o. [id number] female Married Caucasian female who presents for her second Gardasil injection. Order to administer given by Harland Birkenhead, MD on 03/22/2024.   Objective:    BP 115/78   Pulse 86   Ht 5' 3 (1.6 m)   Wt 116 lb 1.6 oz (52.7 kg)   LMP 09/23/2024 (Approximate)   BMI 20.57 kg/m   43 y.o. LMP:  09/23/2024  Contraception:  Partner had a vasectomy Given by: Burnard Ro, CMA Site:  left deltoid  Lab Review  No results found for any visits on 09/26/24.    Assessment:   1. Need for HPV vaccination      Plan:   Patient will return in 4 months for third injection.    Burnard LITTIE Ro, CMA

## 2024-09-28 DIAGNOSIS — Z79899 Other long term (current) drug therapy: Secondary | ICD-10-CM | POA: Diagnosis not present

## 2024-09-28 DIAGNOSIS — F411 Generalized anxiety disorder: Secondary | ICD-10-CM | POA: Diagnosis not present

## 2024-09-28 DIAGNOSIS — Z5181 Encounter for therapeutic drug level monitoring: Secondary | ICD-10-CM | POA: Diagnosis not present

## 2024-09-28 DIAGNOSIS — F332 Major depressive disorder, recurrent severe without psychotic features: Secondary | ICD-10-CM | POA: Diagnosis not present

## 2024-09-28 DIAGNOSIS — F900 Attention-deficit hyperactivity disorder, predominantly inattentive type: Secondary | ICD-10-CM | POA: Diagnosis not present

## 2024-09-28 DIAGNOSIS — F1320 Sedative, hypnotic or anxiolytic dependence, uncomplicated: Secondary | ICD-10-CM | POA: Diagnosis not present

## 2024-09-30 ENCOUNTER — Ambulatory Visit: Admitting: Nurse Practitioner

## 2024-10-05 DIAGNOSIS — F332 Major depressive disorder, recurrent severe without psychotic features: Secondary | ICD-10-CM | POA: Diagnosis not present

## 2024-10-12 DIAGNOSIS — F900 Attention-deficit hyperactivity disorder, predominantly inattentive type: Secondary | ICD-10-CM | POA: Diagnosis not present

## 2024-10-12 DIAGNOSIS — F1320 Sedative, hypnotic or anxiolytic dependence, uncomplicated: Secondary | ICD-10-CM | POA: Diagnosis not present

## 2024-10-12 DIAGNOSIS — Z79899 Other long term (current) drug therapy: Secondary | ICD-10-CM | POA: Diagnosis not present

## 2024-10-12 DIAGNOSIS — F332 Major depressive disorder, recurrent severe without psychotic features: Secondary | ICD-10-CM | POA: Diagnosis not present

## 2024-10-12 DIAGNOSIS — F411 Generalized anxiety disorder: Secondary | ICD-10-CM | POA: Diagnosis not present

## 2024-10-12 DIAGNOSIS — Z5181 Encounter for therapeutic drug level monitoring: Secondary | ICD-10-CM | POA: Diagnosis not present

## 2024-11-13 ENCOUNTER — Encounter: Payer: Self-pay | Admitting: Nurse Practitioner

## 2024-11-13 DIAGNOSIS — E669 Obesity, unspecified: Secondary | ICD-10-CM

## 2024-11-14 MED ORDER — WEGOVY 1.7 MG/0.75ML ~~LOC~~ SOAJ: 1.7000 mg | SUBCUTANEOUS | 2 refills | Status: AC

## 2024-11-29 ENCOUNTER — Telehealth: Payer: Self-pay | Admitting: *Deleted

## 2024-11-29 ENCOUNTER — Other Ambulatory Visit: Payer: Self-pay | Admitting: Nurse Practitioner

## 2024-11-29 DIAGNOSIS — I10 Essential (primary) hypertension: Secondary | ICD-10-CM

## 2024-11-29 MED ORDER — LOSARTAN POTASSIUM 100 MG PO TABS: 100.0000 mg | ORAL_TABLET | Freq: Every day | ORAL | 3 refills | Status: AC

## 2024-11-29 NOTE — Telephone Encounter (Signed)
 Detailed vm left informing pt that losartan  script has been sent in to reflect how she takes it. 100 mg once daily

## 2025-01-11 ENCOUNTER — Encounter: Admitting: Nurse Practitioner

## 2025-01-26 ENCOUNTER — Ambulatory Visit

## 2025-02-07 ENCOUNTER — Ambulatory Visit: Admitting: Nurse Practitioner

## 2025-03-08 ENCOUNTER — Encounter: Admitting: Nurse Practitioner
